# Patient Record
Sex: Female | Born: 1940 | Race: White | Hispanic: No | Marital: Single | Smoking: Never smoker
Health system: Southern US, Community
[De-identification: ages and names within clinical notes are randomized; demographics above are authoritative.]

## PROBLEM LIST (undated history)

## (undated) DIAGNOSIS — J9690 Respiratory failure, unspecified, unspecified whether with hypoxia or hypercapnia: Secondary | ICD-10-CM

## (undated) DIAGNOSIS — I10 Essential (primary) hypertension: Secondary | ICD-10-CM

## (undated) DIAGNOSIS — H409 Unspecified glaucoma: Secondary | ICD-10-CM

## (undated) DIAGNOSIS — F419 Anxiety disorder, unspecified: Secondary | ICD-10-CM

## (undated) DIAGNOSIS — N289 Disorder of kidney and ureter, unspecified: Secondary | ICD-10-CM

## (undated) DIAGNOSIS — K219 Gastro-esophageal reflux disease without esophagitis: Secondary | ICD-10-CM

## (undated) DIAGNOSIS — J45909 Unspecified asthma, uncomplicated: Secondary | ICD-10-CM

## (undated) DIAGNOSIS — I2510 Atherosclerotic heart disease of native coronary artery without angina pectoris: Secondary | ICD-10-CM

## (undated) DIAGNOSIS — J189 Pneumonia, unspecified organism: Secondary | ICD-10-CM

---

## 1998-04-19 ENCOUNTER — Other Ambulatory Visit: Admission: RE | Admit: 1998-04-19 | Discharge: 1998-04-19 | Payer: Self-pay | Admitting: Obstetrics and Gynecology

## 1998-06-13 ENCOUNTER — Inpatient Hospital Stay (HOSPITAL_COMMUNITY): Admission: RE | Admit: 1998-06-13 | Discharge: 1998-06-14 | Payer: Self-pay | Admitting: Obstetrics and Gynecology

## 2000-02-12 ENCOUNTER — Encounter: Admission: RE | Admit: 2000-02-12 | Discharge: 2000-02-12 | Payer: Self-pay | Admitting: Urology

## 2000-02-12 ENCOUNTER — Encounter: Payer: Self-pay | Admitting: Urology

## 2005-12-10 ENCOUNTER — Inpatient Hospital Stay (HOSPITAL_COMMUNITY): Admission: RE | Admit: 2005-12-10 | Discharge: 2005-12-17 | Payer: Self-pay | Admitting: Neurosurgery

## 2005-12-10 ENCOUNTER — Ambulatory Visit: Payer: Self-pay | Admitting: Emergency Medicine

## 2005-12-17 ENCOUNTER — Ambulatory Visit: Payer: Self-pay | Admitting: Physical Medicine & Rehabilitation

## 2005-12-17 ENCOUNTER — Inpatient Hospital Stay (HOSPITAL_COMMUNITY)
Admission: RE | Admit: 2005-12-17 | Discharge: 2005-12-25 | Payer: Self-pay | Admitting: Physical Medicine & Rehabilitation

## 2006-01-08 ENCOUNTER — Encounter: Admission: RE | Admit: 2006-01-08 | Discharge: 2006-01-08 | Payer: Self-pay | Admitting: Neurosurgery

## 2009-05-29 ENCOUNTER — Encounter: Admission: RE | Admit: 2009-05-29 | Discharge: 2009-05-29 | Payer: Self-pay | Admitting: Neurosurgery

## 2009-06-09 ENCOUNTER — Encounter: Admission: RE | Admit: 2009-06-09 | Discharge: 2009-06-09 | Payer: Self-pay | Admitting: Neurosurgery

## 2010-07-19 NOTE — Discharge Summary (Signed)
Judy Mooney, Judy Mooney                ACCOUNT NO.:  [id number]   MEDICAL RECORD NO.:  [id number]          PATIENT TYPE:  INP   LOCATION:  3002                         FACILITY:  [facility]   PHYSICIAN:  Donalee Citrin, M.D.        DATE OF BIRTH:  February 06, 1941   DATE OF ADMISSION:  12/10/2005  DATE OF DISCHARGE:  12/17/2005                                 DISCHARGE SUMMARY   ADMISSION DIAGNOSES:  Severe degenerative scoliosis with spinal stenosis,  neurogenic claudication, and lumbar radiculopathy.   PROCEDURES:  1. Decompressive lumbar laminectomy at L1-2, L2-3, L3-4, and L4-5.  2. Posterior lumbar interbody fusion L1 to L5.   FINAL DIAGNOSES:  Severe degenerative scoliosis with spinal stenosis,  neurogenic claudication, and lumbar radiculopathy.   HOSPITAL COURSE:  The patient was admitted and immediately went to the  operating room with aforementioned procedures performed.  Postoperatively,  the patient did very well, went to the recovery room and then went to the  step-down area.  In step-down, the patient was doing fairly well, was in  postoperative pain; however, this was adequately controlled with pain  medications and a PCA.  The patient progressively mobilized over the couple  days in the hospital with physical and occupational therapy.   The patient initially had some hypovolemic oliguria.  Critical care was  consulted and assisted in managing her hypotension, hypovolemia and  oliguria.  The patient had some elevation in her creatinine that resolved  with fluids over the next couple of days.  In addition, the patient also had  some hyponatremia and anemia.  The patient was transfused for her anemia  with a post transfusion hematocrit of 29.1.  This was stabilized over the  next couple of days.  Throughout the hospitalization, the patient was  progressively mobilized with physical and occupational therapy with which  she did very well.  Subsequently at the time of discharge and transfer  to  rehabilitation, the patient was awake and alert, was tolerating a regular  diet, was ambulating and voiding spontaneously but with assistance, and was  transferred up to the rehab area for continued physical therapy.           ______________________________  Donalee Citrin, M.D.     GC/MEDQ  D:  01/26/2006  T:  01/26/2006  Job:  [id number]

## 2010-07-19 NOTE — H&P (Signed)
NAMEELAYNE, GRUVER                ACCOUNT NO.:  [id number]   MEDICAL RECORD NO.:  [id number]          PATIENT TYPE:  IPS   LOCATION:  4010                         FACILITY:  [facility]   PHYSICIAN:  Ranelle Oyster, M.D.DATE OF BIRTH:  [birth year]   DATE OF ADMISSION:  12/17/2005  DATE OF DISCHARGE:                                HISTORY & PHYSICAL   CHIEF COMPLAINT:  Low back pain and radiculopathy.   HISTORY OF PRESENT ILLNESS:  This is a 70 year old white female with history  of low back pain radiating to the lower extremities.  The patient has  significant scoliosis of L1-L5 with degenerative disk disease.  She  underwent a fusion of L1-L5 on December 10, 2005, by Dr. Wynetta Emery.  The patient  is having issues with postop pain control although this is gradually  improving.  She is having postoperative hemoglobin 6.9 for which she was  transfused 2 units of packed red blood cells on December 10, 2005.  Hemoglobin is increased to 9.0.  The patient has had some loose stool over  the last 24 hours which is felt to be secondary to her stool softeners.  Colace was stopped yesterday.  However, the patient has persisted with  frequent loose BMs.  The patient continues needing significant assistance  with self-care and ADLs, as well as mobility.   REVIEW OF SYSTEMS:  Positive for insomnia, anxiety, low back pain, and  diarrhea.  Other pertinent positives as listed above.   PAST MEDICAL HISTORY:  1. Positive for hypertension.  2. Anxiety.  3. Reflux disease.  4. Melanoma on the right leg, status post surgical incision.  5. Bladder tack.  6. Hysterectomy.  7. Appendectomy.  8. The patient denies alcohol or tobacco history.   FAMILY HISTORY:  Positive for coronary artery disease.   SOCIAL HISTORY:  The patient lives with her husband in [place].  Husband  and local family can assist her as needed.  The patient has a one-level  house with a ramp.  She was independent prior to arrival.   ALLERGIES:  1. SEPTRA.  2. TETRACYCLINE.  3. PENICILLIN.   MEDICATIONS AT HOME:  1. Hyoscyamine 0.125 mg daily p.r.n.  2. Darvocet p.r.n.  3. Doxazosin 4 mg nightly.  4. Enalapril 20 mg b.i.d.  5. Prevacid 30 mg daily.  6. Alprazolam 0.5 mg b.i.d. and nightly.  7. TUMS p.r.n.  8. Benadryl 25 mg nightly p.r.n.   LABORATORY DATA:  Hemoglobin 9.0, white count 11.0, platelets 109000.  Sodium 135.  Potassium 3.9.  BUN and creatinine 12 and 1.2.   PHYSICAL EXAMINATION:  Blood pressure is 130/80.  Pulse is 80.  Respiratory  rate 18.  Temperature 98.4.  The patient is pleasant.  No acute distress.  She is alert and oriented x3.  She has multiple ecchymosis on the forearms  and upper arm.  She has abrasions over the lip and nose.  Ear, nose and  throat exam was unremarkable otherwise except for dry oral mucosa.  There  was some tenting noted in the skin as well.  Neck was supple.  Non JVD,  lymphadenopathy.  Chest was clear to auscultation bilateral without wheezes,  rales or rhonchi.  Heart is regular rate and rhythm without murmur, rubs or  gallops.  Abdomen soft, nontender.  Bowel sounds are positive.  No  distention was noted.  Pulse are 2+ and no clubbing, cyanosis or edema was  appreciated in the extremities.  Neurologically,  cranial nerves II through  XII are intact.  Reflexes are 1+ in lower extremities.  Sensation appears to  be grossly intact in all 4 extremities.  Judgment, orientation, memory and  move are all appropriate today.  Motor function was 5/5 in the upper  extremities.  Lower extremity strength was 2/5 approximately to 4/5  distally.  She was a bit stronger on the right than on the left.   ASSESSMENT AND PLAN:  1. Functional deficits secondary to lumbar scoliosis degenerative disk      disease status post fusion surgery at the L1-L5.  Begin comprehension      patient rehab with PT to assess and treat for mobility and lower      extremity use.  OT will assess for  upper extremity use and ADLs.  24-      hour rehab nursing will assist in bowel, bladder, pain and skin      management.  The rehab case manager/social worker will assess for psych      and social needs.  Estimated length of stay is 10 days.  Goals:      Supervision.  Prognosis:  Good.  2. Pain management:  Darvocet p.r.n. and Flexeril p.r.n.  3. DVT prophylaxis:  Subcutaneous Lovenox.  4. Postoperative anemia:  Check a.m. CBC.  Continue iron supplement.  5. Hypertension:  Monitor current numbers.  6. Anxiety:  Xanax t.i.d.  7. Loose stool.  Stop Colace.  Begin p.r.n. Imodium.  Check next loose      stool for C. diff.      Ranelle Oyster, M.D.  Electronically Signed     ZTS/MEDQ  D:  12/17/2005  T:  12/18/2005  Job:  [id number]

## 2010-07-19 NOTE — Op Note (Signed)
Judy Mooney, Judy Mooney                ACCOUNT NO.:  [id number]   MEDICAL RECORD NO.:  [id number]          PATIENT TYPE:  INP   LOCATION:  3113                         FACILITY:  [facility]   PHYSICIAN:  Donalee Citrin, M.D.        DATE OF BIRTH:  [birth year]   DATE OF PROCEDURE:  12/10/2005  DATE OF DISCHARGE:                                 OPERATIVE REPORT   PREOPERATIVE DIAGNOSES:  1. Degenerative scoliosis L1 to L5.  2. Lumbar spinal stenosis L2-L3, L3-L4 and L4-L5.  3. Lumbar degenerative disk disease L2-L3, L3-L4 and L4-L5 and L3, L4 and      L5 radiculopathy bilaterally.   PROCEDURE:  1. Decompressive lumbar laminectomies L2-L3, L3-L4, and L4-L5 in excess of      what we needed for a standard interbody fusion.  2. Posterior lumbar interbody fusion L2-L3, L3-L4 and L4-L5 using hybrid      Telamon peak cages and Tangent allograft wedges, 1 cage and 1 wedge at      each level.  3. Pedicle screw fixation L1 to L5 using the 6.35 Legacy pedicle screw      system.  4. Posterolateral listhesis L1 to L5 using locally harvested allograft      mixed with VTOS bone substitute.  5. Reduction of spinal deformity L2 to L5.  6. Placement of medium Hemovac drain.   SURGEON:  Donalee Citrin, M.D.   Threasa HeadsYetta Barre.   ANESTHESIA:  General endotracheal.   HISTORY OF PRESENT ILLNESS:  The patient is a very pleasant 70 year old  female who has had longstanding back and progressive back and leg weakness  and pain with neurogenic claudication.  Preoperative imaging showed severe  lumbar spinal stenosis predominantly at L2-L3, L3-L4 and L4-L5 and  degenerative scoliosis at L2-L3, L3-L4 and L4-L5 as well as partially at L1.  The patient failed all forms of conservative treatment with physical  therapy, steroid injections, time, anti-inflammatories, and got  progressively worsening weakness in her legs and decreased quality of life  and difficulty ambulating.  Due to the failure of conservative treatment,  her preoperative imaging and her physical exam, the patient was recommended  decompressive laminectomies at L2-L3, L3-L4, L4-L5 and because of the degree  of degenerative scoliosis and degenerative disk disease, it was felt the  only way to reduce the scoliotic deformity and obtain adequate decompression  was to augment this with an interbody fusion.  Due to scoliosis up to L1,  the fusion was carried up to L1 without any interbody work.  The disk space  was reasonably preserved without scoliotic collapse at L1-L2, so it was  elected not to go into the disk space at L1-L2.   FINDINGS:  Marked facet arthropathy, degenerative joint disease and  degenerative disk disease at L2-L3, L3-L4, and L4-L5.  The L4-L5 facet on  the right was noted to be markedly diastased, almost 1 cm in width.  The  other joints at L2-L3, L3-L4 and L4-L5 were causing marked hourglass  compression on the thecal sac bilaterally with severe collapse of the L3, L4  and L5  nerve roots.   DESCRIPTION OF PROCEDURE:  The patient was brought to the OR and was induced  under general anesthesia.  Was positioned prone on a Wilson frame and draped  in a sterile fashion.  After infiltration of 10 mL of lidocaine with  epinephrine, a midline incision was made and Bovie electrocautery was used  to taken down subq.  Subperiosteal dissection was carried out to the lamina  of L1, L2, L3, L4 and L5 bilaterally.  Self-retaining retractor was placed  at L1 through L5 pedicles and TPs were identified.  __________  confirmed  localization of the appropriate level.  Then after identification of the  appropriate levels,  the spinous processes at L2, L3 and L4 were removed to  decompress the L2-L3, L3-L4, and L4-L5 disk spaces.  The joint spaces of  facet joints revealed markedly degenerating collapse and severely  hypertrophic.  These were all drilled down with a high-speed drill and a  Leksell rongeur.  Complete central decompression of  laminectomy was  performed and working around the marked hourglass compression, immediately  visualized predominantly at L3-L4 and L4-L5.  After the standard  decompression, complete medial facetectomies were performed working above  the ligament.  Then after this was achieved, the ligament was teased away  with a 4 Penfield, removed in piecemeal fashion decompressing the L2, L3, L4  and L5 nerve roots bilaterally.  After complete medial facetectomies were  performed, all 8 of those nerve roots were radically decompressed out their  foramen and skeletonized and significantly unroofed.  The disk spaces were  inspected.  The epidural veins were coagulated, disks were noted to be  markedly collapsed and degenerated.  First, attention was taken to pedicle  screw placement.  Using a high-speed drill, a pilot hole was drilled at L5  on the patient's right side, cannulated with the awl, probed from within the  pedicle as well as within the canal, tapped with a 5.5 tap, probed again and  a 6.5 x 45 screw was inserted L5 on the right.  Fluoroscopy confirmed  trajectory at each step along the way as well as probing from within the  pedicle and within the canal and confirmed no medial or lateral breach.  This procedure was repeated at L4, L3, L2 and L1 on the patient's right side  as well as from L1 to L5 on the patient's left side.  All 10 screws had  excellent purchase.  All were confirmed with fluoroscopy and no medial or  lateral breach was confirmed from probing within the pedicle and within the  canal and bony landmarks.  After all 10 screws had been placed, __________  interbody work using a __________  reflecting the left L5 nerve root  medially, annulotomy was made at the L4-L5 disk space.  This disk space was  inspected and noted to be markedly degenerated.  A size 10 distractor was  inserted.  This has good apposition of the endplates and felt to be appropriate size for the grafts, so 10 x  22 Telamon and 10 x 26 mm Tangent  grafts were opened up for this.  The interspace was prepared with size 10  cutter and chisel after large central disks were removed from the central  compartment decompressing the central canal and a Telamon cage was inserted  on the patient's right side.  The allograft and bone graft wedges were  inserted centrally against the Telamon after preparation  being placed on  both sides and  a Tangent was inserted on the patient's left side.  This  procedure was repeated at each interspace at L3-L4 next and L2-L3 following  with Telamon on the patient's right side at L3-L4, Tangent on the left at L3-  L4, Telamon on the left at L2-L3 and Tangent on the right at L2-L3. All  disks, especially L2-L3 and L3-L4, were markedly collapsed and degenerated.  They were opened up with a size 8 distractor at L3-L4.  This significantly  reduced the scoliotic deformity at L3-L4 as well as the 10 distractor had  done that at L4-L5.  At L2-L3 in addition, initially a 7 distractor was  inserted due to the marked collapse of the disk space and an 8 distractor  was sequentially sized up and using the distractors and placement of the  wedges, reduced her scoliosis at both of these levels.   After all of the above work had been done and the bone grafts appeared to be  in good position, the wound was copiously irrigated and meticulous  hemostasis was maintained.  The neural foramina were explored with a hockey  stick and coronary dilator and confirmed to be widely patent.  Gelfoam was  laid on top of the dura.  The wound was copiously irrigated.  Aggressive  decortication was carried out on the TPs bilaterally.  The remainder of the  locally harvested allograft mixed with VTOS bone substitute was packed along  lateral gutters and TPs and lateral facet complexes as it had already been  packed centrally between the interbody spaces.  Then after all of the grafts  had been inserted, 2  rods were sized, cut, lordosed.  The nuts were  tightened down at L5.  L4 was compressed against L5, L3 compressed against  L4, L2 compressed against L3 and L1 was tightened in situ.  Then a 4 x 22  CrossLink was inserted.  Postop fluoroscopy confirmed good position of the  screws, rods, and bone grafts.  Gelfoam was again reinspected.  Neural  foramina were reinspected.  The medium Hemovac drain was placed underneath  the crosslink.  The muscles were approximated with just some hemostatic  suture.  The fascia and subcutaneous tissue were closed  with interrupted Vicryl and the skin was closed with running 4-0  subcuticular.  Benzoin and Steri-Strips were applied.  The patient went to  the recovery room in stable condition.  At the end of the case the sponge  and instrument counts were correct.           ______________________________  Donalee Citrin, M.D.     GC/MEDQ  D:  12/10/2005  T:  12/11/2005  Job:  [id number]

## 2010-07-19 NOTE — Discharge Summary (Signed)
Judy Mooney, Judy Mooney                ACCOUNT NO.:  [id number]   MEDICAL RECORD NO.:  [id number]          PATIENT TYPE:  IPS   LOCATION:  4010                         FACILITY:  [facility]   PHYSICIAN:  Mariam Dollar, P.A.  DATE OF BIRTH:  26-Nov-1940   DATE OF ADMISSION:  12/17/2005  DATE OF DISCHARGE:  12/25/2005                                 DISCHARGE SUMMARY   DISCHARGE DIAGNOSES:  1. Status post posterior spinal fusion lumbar L1-L5, secondary scoliosis,      degenerative disc disease, December 10, 2005.  2. Pain management.  3. Subcutaneous Lovenox for deep vein thrombosis prophylaxis.  4. Postoperative anemia.  5. Hypertension.  6. Anxiety.  7. Gastroesophageal reflux disease.   A 70 year old female admitted December 10, 2005 with low back pain radiating  to the lower extremities.  X-rays and imaging with scoliosis lumbar L1-L5.  Underwent posterior spinal fusion lumbar L1-L5 December 10, 2005 per Dr.  Wynetta Emery.  Postoperative pain control. PCA discontinued December 13, 2005.  Postoperative anemia 6.9, transfused 2 units of packed red blood cells  December 10, 2005, with hemoglobin improved to 9.  No chest pain, no nausea  or vomiting.  The patient was admitted for comprehensive rehab program.   PAST MEDICAL HISTORY:  See discharge diagnoses.   No alcohol or tobacco.   ALLERGIES:  1. SEPTRA.  2. TETRACYCLINE.  3. PENICILLIN.   SOCIAL HISTORY:  Lives with husband in [place].  Husband and local  family can assist. One level home with a ramp.   MEDICATION PRIOR TO ADMISSION:  1. Darvocet as needed.  2. Doxazosin  4 mg at bedtime  3. Enalapril 20 mg twice daily.  4. Prevacid 30 mg daily.  5. Alprazolam 3 times daily.  6. Benadryl at bedtime.  7. Hyoscyamine 0.125 mg as needed.   REHABILITATION HOSPITAL COURSE:  The patient was admitted to inpatient rehab  services, with therapies initiated on a 3-hour daily basis consisting of  physical therapy, occupational therapy and  rehabilitation nursing. The  following issues were followed during the patient's rehabilitation stay.  Pertaining to Ms. Lanahan's recent posterior spinal fusion lumbar L1-L5,  December 10, 2005, surgical site healing nicely.  She was wearing a back  corset when out of bed. Neurovascular sensation intact.  She was minimal  assist for transfers, close supervision for ambulation.  She was using  Vicodin on a limited basis for pain as well as Flexeril for muscle spasms.  Subcutaneous Lovenox was initiated for deep vein thrombosis prophylaxis.  Postoperative anemia stable, with latest hemoglobin of 10.2, hematocrit  30.2.  She was using Xanax on a limited basis for anxiety.  She did have a  documented history of hypertension. with diastolic pressures 81-93.  Her  enalapril would be resumed as prior to hospital admission.  She was treated  for a Escherichia coli urinary tract infection with Cipro during her  rehabilitation stay.  She denied any dysuria or hematuria.  She was  discharged to home in stable condition.   Latest labs showed BUN 19, creatinine 1.3, sodium 135, potassium 4.7. She  was treated for a hypokalemia at 2.6, with supplement added, improved to  4.7. Hemoglobin 10.2, hematocrit 30.2.   DISCHARGE MEDICATIONS:  1. Xanax 0.5 mg 3 times daily.  2. Protonix 40 mg daily.  3. Multivitamin daily.  4. Enalapril 20 mg twice daily.  5. Cipro 250 mg twice daily x5 days.  6. Vicodin 5/500, 1 or 2 tablets every 6 hours as needed for pain,      dispense of 180 pills.  7. Flexeril 10 mg 3 times daily as needed.   ACTIVITY:  As tolerated.   DIET:  Regular.   WOUND CARE:  Cleanse incision daily with warm soap and water. Back corset  when out of bed.   The patient was advised to follow up with Dr. Wynetta Emery.      Mariam Dollar, P.A.     DA/MEDQ  D:  12/24/2005  T:  12/25/2005  Job:  [id number]   cc:   Donalee Citrin, M.D.  Dr. Tomasa Blase

## 2013-05-04 ENCOUNTER — Encounter (INDEPENDENT_AMBULATORY_CARE_PROVIDER_SITE_OTHER): Payer: Self-pay

## 2015-05-11 DIAGNOSIS — H40013 Open angle with borderline findings, low risk, bilateral: Secondary | ICD-10-CM | POA: Diagnosis not present

## 2015-05-11 DIAGNOSIS — H5203 Hypermetropia, bilateral: Secondary | ICD-10-CM | POA: Diagnosis not present

## 2015-08-03 DIAGNOSIS — E785 Hyperlipidemia, unspecified: Secondary | ICD-10-CM | POA: Diagnosis not present

## 2015-08-03 DIAGNOSIS — I10 Essential (primary) hypertension: Secondary | ICD-10-CM | POA: Diagnosis not present

## 2015-08-03 DIAGNOSIS — M5137 Other intervertebral disc degeneration, lumbosacral region: Secondary | ICD-10-CM | POA: Diagnosis not present

## 2015-08-03 DIAGNOSIS — R7301 Impaired fasting glucose: Secondary | ICD-10-CM | POA: Diagnosis not present

## 2015-08-03 DIAGNOSIS — Z6831 Body mass index (BMI) 31.0-31.9, adult: Secondary | ICD-10-CM | POA: Diagnosis not present

## 2015-08-03 DIAGNOSIS — D509 Iron deficiency anemia, unspecified: Secondary | ICD-10-CM | POA: Diagnosis not present

## 2015-08-03 DIAGNOSIS — N3010 Interstitial cystitis (chronic) without hematuria: Secondary | ICD-10-CM | POA: Diagnosis not present

## 2016-02-08 DIAGNOSIS — N3010 Interstitial cystitis (chronic) without hematuria: Secondary | ICD-10-CM | POA: Diagnosis not present

## 2016-02-08 DIAGNOSIS — D509 Iron deficiency anemia, unspecified: Secondary | ICD-10-CM | POA: Diagnosis not present

## 2016-02-08 DIAGNOSIS — E785 Hyperlipidemia, unspecified: Secondary | ICD-10-CM | POA: Diagnosis not present

## 2016-02-08 DIAGNOSIS — Z23 Encounter for immunization: Secondary | ICD-10-CM | POA: Diagnosis not present

## 2016-02-08 DIAGNOSIS — E559 Vitamin D deficiency, unspecified: Secondary | ICD-10-CM | POA: Diagnosis not present

## 2016-02-08 DIAGNOSIS — I129 Hypertensive chronic kidney disease with stage 1 through stage 4 chronic kidney disease, or unspecified chronic kidney disease: Secondary | ICD-10-CM | POA: Diagnosis not present

## 2016-02-08 DIAGNOSIS — M5137 Other intervertebral disc degeneration, lumbosacral region: Secondary | ICD-10-CM | POA: Diagnosis not present

## 2016-02-08 DIAGNOSIS — Z6832 Body mass index (BMI) 32.0-32.9, adult: Secondary | ICD-10-CM | POA: Diagnosis not present

## 2016-02-08 DIAGNOSIS — L989 Disorder of the skin and subcutaneous tissue, unspecified: Secondary | ICD-10-CM | POA: Diagnosis not present

## 2016-02-08 DIAGNOSIS — Z1389 Encounter for screening for other disorder: Secondary | ICD-10-CM | POA: Diagnosis not present

## 2016-02-08 DIAGNOSIS — R7301 Impaired fasting glucose: Secondary | ICD-10-CM | POA: Diagnosis not present

## 2016-02-08 DIAGNOSIS — Z9181 History of falling: Secondary | ICD-10-CM | POA: Diagnosis not present

## 2016-03-25 DIAGNOSIS — N184 Chronic kidney disease, stage 4 (severe): Secondary | ICD-10-CM | POA: Diagnosis not present

## 2016-03-25 DIAGNOSIS — K219 Gastro-esophageal reflux disease without esophagitis: Secondary | ICD-10-CM | POA: Diagnosis not present

## 2016-03-25 DIAGNOSIS — Z6832 Body mass index (BMI) 32.0-32.9, adult: Secondary | ICD-10-CM | POA: Diagnosis not present

## 2016-03-25 DIAGNOSIS — I129 Hypertensive chronic kidney disease with stage 1 through stage 4 chronic kidney disease, or unspecified chronic kidney disease: Secondary | ICD-10-CM | POA: Diagnosis not present

## 2016-03-25 DIAGNOSIS — Z139 Encounter for screening, unspecified: Secondary | ICD-10-CM | POA: Diagnosis not present

## 2016-08-15 DIAGNOSIS — N3010 Interstitial cystitis (chronic) without hematuria: Secondary | ICD-10-CM | POA: Diagnosis not present

## 2016-08-15 DIAGNOSIS — M5137 Other intervertebral disc degeneration, lumbosacral region: Secondary | ICD-10-CM | POA: Diagnosis not present

## 2016-08-15 DIAGNOSIS — N183 Chronic kidney disease, stage 3 (moderate): Secondary | ICD-10-CM | POA: Diagnosis not present

## 2016-08-15 DIAGNOSIS — E785 Hyperlipidemia, unspecified: Secondary | ICD-10-CM | POA: Diagnosis not present

## 2016-08-15 DIAGNOSIS — R7301 Impaired fasting glucose: Secondary | ICD-10-CM | POA: Diagnosis not present

## 2016-08-15 DIAGNOSIS — Z6829 Body mass index (BMI) 29.0-29.9, adult: Secondary | ICD-10-CM | POA: Diagnosis not present

## 2016-08-15 DIAGNOSIS — I129 Hypertensive chronic kidney disease with stage 1 through stage 4 chronic kidney disease, or unspecified chronic kidney disease: Secondary | ICD-10-CM | POA: Diagnosis not present

## 2016-08-15 DIAGNOSIS — E559 Vitamin D deficiency, unspecified: Secondary | ICD-10-CM | POA: Diagnosis not present

## 2016-08-15 DIAGNOSIS — D509 Iron deficiency anemia, unspecified: Secondary | ICD-10-CM | POA: Diagnosis not present

## 2016-10-17 DIAGNOSIS — H35363 Drusen (degenerative) of macula, bilateral: Secondary | ICD-10-CM | POA: Diagnosis not present

## 2016-10-17 DIAGNOSIS — H5203 Hypermetropia, bilateral: Secondary | ICD-10-CM | POA: Diagnosis not present

## 2016-11-28 DIAGNOSIS — K529 Noninfective gastroenteritis and colitis, unspecified: Secondary | ICD-10-CM | POA: Diagnosis not present

## 2016-11-28 DIAGNOSIS — Z6833 Body mass index (BMI) 33.0-33.9, adult: Secondary | ICD-10-CM | POA: Diagnosis not present

## 2016-12-01 DIAGNOSIS — K529 Noninfective gastroenteritis and colitis, unspecified: Secondary | ICD-10-CM | POA: Diagnosis not present

## 2017-02-20 DIAGNOSIS — Z9181 History of falling: Secondary | ICD-10-CM | POA: Diagnosis not present

## 2017-02-20 DIAGNOSIS — N183 Chronic kidney disease, stage 3 (moderate): Secondary | ICD-10-CM | POA: Diagnosis not present

## 2017-02-20 DIAGNOSIS — Z139 Encounter for screening, unspecified: Secondary | ICD-10-CM | POA: Diagnosis not present

## 2017-02-20 DIAGNOSIS — N3010 Interstitial cystitis (chronic) without hematuria: Secondary | ICD-10-CM | POA: Diagnosis not present

## 2017-02-20 DIAGNOSIS — I129 Hypertensive chronic kidney disease with stage 1 through stage 4 chronic kidney disease, or unspecified chronic kidney disease: Secondary | ICD-10-CM | POA: Diagnosis not present

## 2017-02-20 DIAGNOSIS — D509 Iron deficiency anemia, unspecified: Secondary | ICD-10-CM | POA: Diagnosis not present

## 2017-02-20 DIAGNOSIS — Z6833 Body mass index (BMI) 33.0-33.9, adult: Secondary | ICD-10-CM | POA: Diagnosis not present

## 2017-02-20 DIAGNOSIS — Z23 Encounter for immunization: Secondary | ICD-10-CM | POA: Diagnosis not present

## 2017-02-20 DIAGNOSIS — M5137 Other intervertebral disc degeneration, lumbosacral region: Secondary | ICD-10-CM | POA: Diagnosis not present

## 2017-02-20 DIAGNOSIS — Z1331 Encounter for screening for depression: Secondary | ICD-10-CM | POA: Diagnosis not present

## 2017-02-20 DIAGNOSIS — E559 Vitamin D deficiency, unspecified: Secondary | ICD-10-CM | POA: Diagnosis not present

## 2017-02-20 DIAGNOSIS — R7301 Impaired fasting glucose: Secondary | ICD-10-CM | POA: Diagnosis not present

## 2017-02-20 DIAGNOSIS — E785 Hyperlipidemia, unspecified: Secondary | ICD-10-CM | POA: Diagnosis not present

## 2017-05-26 DIAGNOSIS — N390 Urinary tract infection, site not specified: Secondary | ICD-10-CM | POA: Diagnosis not present

## 2017-05-26 DIAGNOSIS — N3020 Other chronic cystitis without hematuria: Secondary | ICD-10-CM | POA: Diagnosis not present

## 2017-07-03 DIAGNOSIS — R600 Localized edema: Secondary | ICD-10-CM | POA: Diagnosis not present

## 2017-08-21 DIAGNOSIS — M5137 Other intervertebral disc degeneration, lumbosacral region: Secondary | ICD-10-CM | POA: Diagnosis not present

## 2017-08-21 DIAGNOSIS — D509 Iron deficiency anemia, unspecified: Secondary | ICD-10-CM | POA: Diagnosis not present

## 2017-08-21 DIAGNOSIS — E785 Hyperlipidemia, unspecified: Secondary | ICD-10-CM | POA: Diagnosis not present

## 2017-08-21 DIAGNOSIS — N3020 Other chronic cystitis without hematuria: Secondary | ICD-10-CM | POA: Diagnosis not present

## 2017-08-21 DIAGNOSIS — R7301 Impaired fasting glucose: Secondary | ICD-10-CM | POA: Diagnosis not present

## 2017-08-21 DIAGNOSIS — N183 Chronic kidney disease, stage 3 (moderate): Secondary | ICD-10-CM | POA: Diagnosis not present

## 2017-08-21 DIAGNOSIS — E559 Vitamin D deficiency, unspecified: Secondary | ICD-10-CM | POA: Diagnosis not present

## 2017-08-21 DIAGNOSIS — I129 Hypertensive chronic kidney disease with stage 1 through stage 4 chronic kidney disease, or unspecified chronic kidney disease: Secondary | ICD-10-CM | POA: Diagnosis not present

## 2017-08-21 DIAGNOSIS — Z1339 Encounter for screening examination for other mental health and behavioral disorders: Secondary | ICD-10-CM | POA: Diagnosis not present

## 2017-08-21 DIAGNOSIS — Z6832 Body mass index (BMI) 32.0-32.9, adult: Secondary | ICD-10-CM | POA: Diagnosis not present

## 2017-10-06 DIAGNOSIS — S42032A Displaced fracture of lateral end of left clavicle, initial encounter for closed fracture: Secondary | ICD-10-CM | POA: Diagnosis not present

## 2017-10-06 DIAGNOSIS — S42002A Fracture of unspecified part of left clavicle, initial encounter for closed fracture: Secondary | ICD-10-CM | POA: Diagnosis not present

## 2017-10-06 DIAGNOSIS — S42022A Displaced fracture of shaft of left clavicle, initial encounter for closed fracture: Secondary | ICD-10-CM | POA: Diagnosis not present

## 2017-11-13 DIAGNOSIS — J9801 Acute bronchospasm: Secondary | ICD-10-CM | POA: Diagnosis not present

## 2017-11-13 DIAGNOSIS — Z6832 Body mass index (BMI) 32.0-32.9, adult: Secondary | ICD-10-CM | POA: Diagnosis not present

## 2018-02-26 DIAGNOSIS — Z23 Encounter for immunization: Secondary | ICD-10-CM | POA: Diagnosis not present

## 2018-02-26 DIAGNOSIS — N183 Chronic kidney disease, stage 3 (moderate): Secondary | ICD-10-CM | POA: Diagnosis not present

## 2018-02-26 DIAGNOSIS — N3010 Interstitial cystitis (chronic) without hematuria: Secondary | ICD-10-CM | POA: Diagnosis not present

## 2018-02-26 DIAGNOSIS — Z9181 History of falling: Secondary | ICD-10-CM | POA: Diagnosis not present

## 2018-02-26 DIAGNOSIS — E559 Vitamin D deficiency, unspecified: Secondary | ICD-10-CM | POA: Diagnosis not present

## 2018-02-26 DIAGNOSIS — D509 Iron deficiency anemia, unspecified: Secondary | ICD-10-CM | POA: Diagnosis not present

## 2018-02-26 DIAGNOSIS — M5137 Other intervertebral disc degeneration, lumbosacral region: Secondary | ICD-10-CM | POA: Diagnosis not present

## 2018-02-26 DIAGNOSIS — I129 Hypertensive chronic kidney disease with stage 1 through stage 4 chronic kidney disease, or unspecified chronic kidney disease: Secondary | ICD-10-CM | POA: Diagnosis not present

## 2018-02-26 DIAGNOSIS — Z1331 Encounter for screening for depression: Secondary | ICD-10-CM | POA: Diagnosis not present

## 2018-02-26 DIAGNOSIS — Z139 Encounter for screening, unspecified: Secondary | ICD-10-CM | POA: Diagnosis not present

## 2018-02-26 DIAGNOSIS — R7301 Impaired fasting glucose: Secondary | ICD-10-CM | POA: Diagnosis not present

## 2018-02-26 DIAGNOSIS — E785 Hyperlipidemia, unspecified: Secondary | ICD-10-CM | POA: Diagnosis not present

## 2018-03-04 DIAGNOSIS — R6889 Other general symptoms and signs: Secondary | ICD-10-CM | POA: Diagnosis not present

## 2018-03-04 DIAGNOSIS — R3915 Urgency of urination: Secondary | ICD-10-CM | POA: Diagnosis not present

## 2018-03-05 DIAGNOSIS — N390 Urinary tract infection, site not specified: Secondary | ICD-10-CM | POA: Diagnosis not present

## 2018-04-06 DIAGNOSIS — N390 Urinary tract infection, site not specified: Secondary | ICD-10-CM | POA: Diagnosis not present

## 2018-04-19 DIAGNOSIS — T148XXA Other injury of unspecified body region, initial encounter: Secondary | ICD-10-CM | POA: Diagnosis not present

## 2018-04-19 DIAGNOSIS — N3010 Interstitial cystitis (chronic) without hematuria: Secondary | ICD-10-CM | POA: Diagnosis not present

## 2018-04-19 DIAGNOSIS — Z23 Encounter for immunization: Secondary | ICD-10-CM | POA: Diagnosis not present

## 2018-05-07 DIAGNOSIS — I8310 Varicose veins of unspecified lower extremity with inflammation: Secondary | ICD-10-CM | POA: Diagnosis not present

## 2018-05-07 DIAGNOSIS — R600 Localized edema: Secondary | ICD-10-CM | POA: Diagnosis not present

## 2018-05-10 DIAGNOSIS — L97911 Non-pressure chronic ulcer of unspecified part of right lower leg limited to breakdown of skin: Secondary | ICD-10-CM | POA: Diagnosis not present

## 2018-05-10 DIAGNOSIS — I8310 Varicose veins of unspecified lower extremity with inflammation: Secondary | ICD-10-CM | POA: Diagnosis not present

## 2018-05-14 DIAGNOSIS — H353132 Nonexudative age-related macular degeneration, bilateral, intermediate dry stage: Secondary | ICD-10-CM | POA: Diagnosis not present

## 2018-05-14 DIAGNOSIS — H40013 Open angle with borderline findings, low risk, bilateral: Secondary | ICD-10-CM | POA: Diagnosis not present

## 2018-05-17 DIAGNOSIS — L97911 Non-pressure chronic ulcer of unspecified part of right lower leg limited to breakdown of skin: Secondary | ICD-10-CM | POA: Diagnosis not present

## 2018-06-21 DIAGNOSIS — L97911 Non-pressure chronic ulcer of unspecified part of right lower leg limited to breakdown of skin: Secondary | ICD-10-CM | POA: Diagnosis not present

## 2018-06-21 DIAGNOSIS — R600 Localized edema: Secondary | ICD-10-CM | POA: Diagnosis not present

## 2018-06-21 DIAGNOSIS — I8310 Varicose veins of unspecified lower extremity with inflammation: Secondary | ICD-10-CM | POA: Diagnosis not present

## 2018-07-19 DIAGNOSIS — S81001A Unspecified open wound, right knee, initial encounter: Secondary | ICD-10-CM | POA: Diagnosis not present

## 2018-09-10 DIAGNOSIS — R399 Unspecified symptoms and signs involving the genitourinary system: Secondary | ICD-10-CM | POA: Diagnosis not present

## 2018-09-10 DIAGNOSIS — N183 Chronic kidney disease, stage 3 (moderate): Secondary | ICD-10-CM | POA: Diagnosis not present

## 2018-09-10 DIAGNOSIS — I129 Hypertensive chronic kidney disease with stage 1 through stage 4 chronic kidney disease, or unspecified chronic kidney disease: Secondary | ICD-10-CM | POA: Diagnosis not present

## 2018-09-10 DIAGNOSIS — D509 Iron deficiency anemia, unspecified: Secondary | ICD-10-CM | POA: Diagnosis not present

## 2018-09-10 DIAGNOSIS — E559 Vitamin D deficiency, unspecified: Secondary | ICD-10-CM | POA: Diagnosis not present

## 2018-09-10 DIAGNOSIS — R7301 Impaired fasting glucose: Secondary | ICD-10-CM | POA: Diagnosis not present

## 2018-09-10 DIAGNOSIS — N3010 Interstitial cystitis (chronic) without hematuria: Secondary | ICD-10-CM | POA: Diagnosis not present

## 2018-09-10 DIAGNOSIS — F419 Anxiety disorder, unspecified: Secondary | ICD-10-CM | POA: Diagnosis not present

## 2018-09-10 DIAGNOSIS — M5137 Other intervertebral disc degeneration, lumbosacral region: Secondary | ICD-10-CM | POA: Diagnosis not present

## 2018-09-10 DIAGNOSIS — E785 Hyperlipidemia, unspecified: Secondary | ICD-10-CM | POA: Diagnosis not present

## 2018-09-21 DIAGNOSIS — Z9181 History of falling: Secondary | ICD-10-CM | POA: Diagnosis not present

## 2018-09-21 DIAGNOSIS — Z0000 Encounter for general adult medical examination without abnormal findings: Secondary | ICD-10-CM | POA: Diagnosis not present

## 2018-09-21 DIAGNOSIS — E785 Hyperlipidemia, unspecified: Secondary | ICD-10-CM | POA: Diagnosis not present

## 2018-09-21 DIAGNOSIS — Z6831 Body mass index (BMI) 31.0-31.9, adult: Secondary | ICD-10-CM | POA: Diagnosis not present

## 2018-09-21 DIAGNOSIS — N959 Unspecified menopausal and perimenopausal disorder: Secondary | ICD-10-CM | POA: Diagnosis not present

## 2018-09-21 DIAGNOSIS — Z1231 Encounter for screening mammogram for malignant neoplasm of breast: Secondary | ICD-10-CM | POA: Diagnosis not present

## 2018-09-21 DIAGNOSIS — Z1331 Encounter for screening for depression: Secondary | ICD-10-CM | POA: Diagnosis not present

## 2018-10-15 DIAGNOSIS — H47213 Primary optic atrophy, bilateral: Secondary | ICD-10-CM | POA: Diagnosis not present

## 2018-12-03 DIAGNOSIS — H47293 Other optic atrophy, bilateral: Secondary | ICD-10-CM | POA: Diagnosis not present

## 2018-12-03 DIAGNOSIS — H35363 Drusen (degenerative) of macula, bilateral: Secondary | ICD-10-CM | POA: Diagnosis not present

## 2018-12-03 DIAGNOSIS — H40013 Open angle with borderline findings, low risk, bilateral: Secondary | ICD-10-CM | POA: Diagnosis not present

## 2018-12-03 DIAGNOSIS — H353131 Nonexudative age-related macular degeneration, bilateral, early dry stage: Secondary | ICD-10-CM | POA: Diagnosis not present

## 2019-01-21 DIAGNOSIS — J208 Acute bronchitis due to other specified organisms: Secondary | ICD-10-CM | POA: Diagnosis not present

## 2019-01-21 DIAGNOSIS — B9689 Other specified bacterial agents as the cause of diseases classified elsewhere: Secondary | ICD-10-CM | POA: Diagnosis not present

## 2019-02-23 DIAGNOSIS — J069 Acute upper respiratory infection, unspecified: Secondary | ICD-10-CM | POA: Diagnosis not present

## 2019-03-08 DIAGNOSIS — J208 Acute bronchitis due to other specified organisms: Secondary | ICD-10-CM | POA: Diagnosis not present

## 2019-03-08 DIAGNOSIS — K645 Perianal venous thrombosis: Secondary | ICD-10-CM | POA: Diagnosis not present

## 2019-03-08 DIAGNOSIS — B9689 Other specified bacterial agents as the cause of diseases classified elsewhere: Secondary | ICD-10-CM | POA: Diagnosis not present

## 2019-03-22 DIAGNOSIS — R197 Diarrhea, unspecified: Secondary | ICD-10-CM | POA: Diagnosis not present

## 2019-03-22 DIAGNOSIS — E860 Dehydration: Secondary | ICD-10-CM | POA: Diagnosis not present

## 2019-03-22 DIAGNOSIS — A0472 Enterocolitis due to Clostridium difficile, not specified as recurrent: Secondary | ICD-10-CM | POA: Diagnosis not present

## 2019-03-23 DIAGNOSIS — A0472 Enterocolitis due to Clostridium difficile, not specified as recurrent: Secondary | ICD-10-CM | POA: Diagnosis not present

## 2019-03-23 DIAGNOSIS — R197 Diarrhea, unspecified: Secondary | ICD-10-CM | POA: Diagnosis not present

## 2019-06-10 DIAGNOSIS — E785 Hyperlipidemia, unspecified: Secondary | ICD-10-CM | POA: Diagnosis not present

## 2019-06-10 DIAGNOSIS — I129 Hypertensive chronic kidney disease with stage 1 through stage 4 chronic kidney disease, or unspecified chronic kidney disease: Secondary | ICD-10-CM | POA: Diagnosis not present

## 2019-06-10 DIAGNOSIS — R600 Localized edema: Secondary | ICD-10-CM | POA: Diagnosis not present

## 2019-06-10 DIAGNOSIS — N390 Urinary tract infection, site not specified: Secondary | ICD-10-CM | POA: Diagnosis not present

## 2019-06-10 DIAGNOSIS — M5137 Other intervertebral disc degeneration, lumbosacral region: Secondary | ICD-10-CM | POA: Diagnosis not present

## 2019-06-10 DIAGNOSIS — R7301 Impaired fasting glucose: Secondary | ICD-10-CM | POA: Diagnosis not present

## 2019-06-10 DIAGNOSIS — F419 Anxiety disorder, unspecified: Secondary | ICD-10-CM | POA: Diagnosis not present

## 2019-06-10 DIAGNOSIS — D509 Iron deficiency anemia, unspecified: Secondary | ICD-10-CM | POA: Diagnosis not present

## 2019-06-10 DIAGNOSIS — E559 Vitamin D deficiency, unspecified: Secondary | ICD-10-CM | POA: Diagnosis not present

## 2019-06-10 DIAGNOSIS — N3010 Interstitial cystitis (chronic) without hematuria: Secondary | ICD-10-CM | POA: Diagnosis not present

## 2019-06-10 DIAGNOSIS — N183 Chronic kidney disease, stage 3 unspecified: Secondary | ICD-10-CM | POA: Diagnosis not present

## 2019-06-10 DIAGNOSIS — L89312 Pressure ulcer of right buttock, stage 2: Secondary | ICD-10-CM | POA: Diagnosis not present

## 2019-09-05 DIAGNOSIS — R600 Localized edema: Secondary | ICD-10-CM | POA: Diagnosis not present

## 2019-09-05 DIAGNOSIS — R5381 Other malaise: Secondary | ICD-10-CM | POA: Diagnosis not present

## 2019-09-05 DIAGNOSIS — J9601 Acute respiratory failure with hypoxia: Secondary | ICD-10-CM | POA: Diagnosis not present

## 2019-09-05 DIAGNOSIS — Z87440 Personal history of urinary (tract) infections: Secondary | ICD-10-CM | POA: Diagnosis not present

## 2019-09-05 DIAGNOSIS — R069 Unspecified abnormalities of breathing: Secondary | ICD-10-CM | POA: Diagnosis not present

## 2019-09-05 DIAGNOSIS — D6489 Other specified anemias: Secondary | ICD-10-CM | POA: Diagnosis not present

## 2019-09-05 DIAGNOSIS — Z993 Dependence on wheelchair: Secondary | ICD-10-CM | POA: Diagnosis not present

## 2019-09-05 DIAGNOSIS — J9611 Chronic respiratory failure with hypoxia: Secondary | ICD-10-CM | POA: Diagnosis not present

## 2019-09-05 DIAGNOSIS — F419 Anxiety disorder, unspecified: Secondary | ICD-10-CM | POA: Diagnosis not present

## 2019-09-05 DIAGNOSIS — R2681 Unsteadiness on feet: Secondary | ICD-10-CM | POA: Diagnosis not present

## 2019-09-05 DIAGNOSIS — J80 Acute respiratory distress syndrome: Secondary | ICD-10-CM | POA: Diagnosis not present

## 2019-09-05 DIAGNOSIS — Z66 Do not resuscitate: Secondary | ICD-10-CM | POA: Diagnosis not present

## 2019-09-05 DIAGNOSIS — F411 Generalized anxiety disorder: Secondary | ICD-10-CM | POA: Diagnosis not present

## 2019-09-05 DIAGNOSIS — R404 Transient alteration of awareness: Secondary | ICD-10-CM | POA: Diagnosis not present

## 2019-09-05 DIAGNOSIS — M7989 Other specified soft tissue disorders: Secondary | ICD-10-CM | POA: Diagnosis not present

## 2019-09-05 DIAGNOSIS — D649 Anemia, unspecified: Secondary | ICD-10-CM | POA: Diagnosis not present

## 2019-09-05 DIAGNOSIS — H409 Unspecified glaucoma: Secondary | ICD-10-CM | POA: Diagnosis not present

## 2019-09-05 DIAGNOSIS — J181 Lobar pneumonia, unspecified organism: Secondary | ICD-10-CM | POA: Diagnosis not present

## 2019-09-05 DIAGNOSIS — R0902 Hypoxemia: Secondary | ICD-10-CM | POA: Diagnosis not present

## 2019-09-05 DIAGNOSIS — I2510 Atherosclerotic heart disease of native coronary artery without angina pectoris: Secondary | ICD-10-CM | POA: Diagnosis not present

## 2019-09-05 DIAGNOSIS — B354 Tinea corporis: Secondary | ICD-10-CM | POA: Diagnosis not present

## 2019-09-05 DIAGNOSIS — M25571 Pain in right ankle and joints of right foot: Secondary | ICD-10-CM | POA: Diagnosis not present

## 2019-09-05 DIAGNOSIS — Z7401 Bed confinement status: Secondary | ICD-10-CM | POA: Diagnosis not present

## 2019-09-05 DIAGNOSIS — E872 Acidosis: Secondary | ICD-10-CM | POA: Diagnosis not present

## 2019-09-05 DIAGNOSIS — Z792 Long term (current) use of antibiotics: Secondary | ICD-10-CM | POA: Diagnosis not present

## 2019-09-05 DIAGNOSIS — J45909 Unspecified asthma, uncomplicated: Secondary | ICD-10-CM | POA: Diagnosis not present

## 2019-09-05 DIAGNOSIS — Z79899 Other long term (current) drug therapy: Secondary | ICD-10-CM | POA: Diagnosis not present

## 2019-09-05 DIAGNOSIS — I10 Essential (primary) hypertension: Secondary | ICD-10-CM | POA: Diagnosis not present

## 2019-09-05 DIAGNOSIS — M79671 Pain in right foot: Secondary | ICD-10-CM | POA: Diagnosis not present

## 2019-09-05 DIAGNOSIS — K219 Gastro-esophageal reflux disease without esophagitis: Secondary | ICD-10-CM | POA: Diagnosis not present

## 2019-09-05 DIAGNOSIS — R262 Difficulty in walking, not elsewhere classified: Secondary | ICD-10-CM | POA: Diagnosis not present

## 2019-09-05 DIAGNOSIS — M1990 Unspecified osteoarthritis, unspecified site: Secondary | ICD-10-CM | POA: Diagnosis not present

## 2019-09-05 DIAGNOSIS — R0602 Shortness of breath: Secondary | ICD-10-CM | POA: Diagnosis not present

## 2019-09-05 DIAGNOSIS — M2550 Pain in unspecified joint: Secondary | ICD-10-CM | POA: Diagnosis not present

## 2019-09-05 DIAGNOSIS — J9602 Acute respiratory failure with hypercapnia: Secondary | ICD-10-CM | POA: Diagnosis not present

## 2019-09-05 DIAGNOSIS — J189 Pneumonia, unspecified organism: Secondary | ICD-10-CM | POA: Diagnosis not present

## 2019-09-05 DIAGNOSIS — I129 Hypertensive chronic kidney disease with stage 1 through stage 4 chronic kidney disease, or unspecified chronic kidney disease: Secondary | ICD-10-CM | POA: Diagnosis not present

## 2019-09-05 DIAGNOSIS — N183 Chronic kidney disease, stage 3 unspecified: Secondary | ICD-10-CM | POA: Diagnosis not present

## 2019-09-11 DIAGNOSIS — Z515 Encounter for palliative care: Secondary | ICD-10-CM | POA: Diagnosis not present

## 2019-09-11 DIAGNOSIS — N390 Urinary tract infection, site not specified: Secondary | ICD-10-CM | POA: Diagnosis not present

## 2019-09-11 DIAGNOSIS — A0472 Enterocolitis due to Clostridium difficile, not specified as recurrent: Secondary | ICD-10-CM | POA: Diagnosis not present

## 2019-09-11 DIAGNOSIS — G9341 Metabolic encephalopathy: Secondary | ICD-10-CM | POA: Diagnosis not present

## 2019-09-11 DIAGNOSIS — R918 Other nonspecific abnormal finding of lung field: Secondary | ICD-10-CM | POA: Diagnosis not present

## 2019-09-11 DIAGNOSIS — R05 Cough: Secondary | ICD-10-CM | POA: Diagnosis not present

## 2019-09-11 DIAGNOSIS — R601 Generalized edema: Secondary | ICD-10-CM | POA: Diagnosis not present

## 2019-09-11 DIAGNOSIS — R410 Disorientation, unspecified: Secondary | ICD-10-CM | POA: Diagnosis not present

## 2019-09-11 DIAGNOSIS — G9340 Encephalopathy, unspecified: Secondary | ICD-10-CM | POA: Diagnosis not present

## 2019-09-11 DIAGNOSIS — Z87440 Personal history of urinary (tract) infections: Secondary | ICD-10-CM | POA: Diagnosis not present

## 2019-09-11 DIAGNOSIS — J189 Pneumonia, unspecified organism: Secondary | ICD-10-CM | POA: Diagnosis not present

## 2019-09-11 DIAGNOSIS — F411 Generalized anxiety disorder: Secondary | ICD-10-CM | POA: Diagnosis not present

## 2019-09-11 DIAGNOSIS — R0902 Hypoxemia: Secondary | ICD-10-CM | POA: Diagnosis not present

## 2019-09-11 DIAGNOSIS — Z66 Do not resuscitate: Secondary | ICD-10-CM | POA: Diagnosis not present

## 2019-09-11 DIAGNOSIS — N1831 Chronic kidney disease, stage 3a: Secondary | ICD-10-CM | POA: Diagnosis not present

## 2019-09-11 DIAGNOSIS — A419 Sepsis, unspecified organism: Secondary | ICD-10-CM | POA: Diagnosis not present

## 2019-09-11 DIAGNOSIS — M1990 Unspecified osteoarthritis, unspecified site: Secondary | ICD-10-CM | POA: Diagnosis not present

## 2019-09-11 DIAGNOSIS — Z20822 Contact with and (suspected) exposure to covid-19: Secondary | ICD-10-CM | POA: Diagnosis not present

## 2019-09-11 DIAGNOSIS — A0100 Typhoid fever, unspecified: Secondary | ICD-10-CM | POA: Diagnosis not present

## 2019-09-11 DIAGNOSIS — K219 Gastro-esophageal reflux disease without esophagitis: Secondary | ICD-10-CM | POA: Diagnosis not present

## 2019-09-11 DIAGNOSIS — N3001 Acute cystitis with hematuria: Secondary | ICD-10-CM | POA: Diagnosis not present

## 2019-09-11 DIAGNOSIS — N179 Acute kidney failure, unspecified: Secondary | ICD-10-CM | POA: Diagnosis not present

## 2019-09-11 DIAGNOSIS — R000 Tachycardia, unspecified: Secondary | ICD-10-CM | POA: Diagnosis not present

## 2019-09-11 DIAGNOSIS — J9611 Chronic respiratory failure with hypoxia: Secondary | ICD-10-CM | POA: Diagnosis not present

## 2019-09-11 DIAGNOSIS — H409 Unspecified glaucoma: Secondary | ICD-10-CM | POA: Diagnosis not present

## 2019-09-11 DIAGNOSIS — M25571 Pain in right ankle and joints of right foot: Secondary | ICD-10-CM | POA: Diagnosis not present

## 2019-09-11 DIAGNOSIS — J9602 Acute respiratory failure with hypercapnia: Secondary | ICD-10-CM | POA: Diagnosis not present

## 2019-09-11 DIAGNOSIS — J9811 Atelectasis: Secondary | ICD-10-CM | POA: Diagnosis not present

## 2019-09-11 DIAGNOSIS — M2550 Pain in unspecified joint: Secondary | ICD-10-CM | POA: Diagnosis not present

## 2019-09-11 DIAGNOSIS — R2681 Unsteadiness on feet: Secondary | ICD-10-CM | POA: Diagnosis not present

## 2019-09-11 DIAGNOSIS — J45909 Unspecified asthma, uncomplicated: Secondary | ICD-10-CM | POA: Diagnosis not present

## 2019-09-11 DIAGNOSIS — R5381 Other malaise: Secondary | ICD-10-CM | POA: Diagnosis not present

## 2019-09-11 DIAGNOSIS — H5704 Mydriasis: Secondary | ICD-10-CM | POA: Diagnosis not present

## 2019-09-11 DIAGNOSIS — I10 Essential (primary) hypertension: Secondary | ICD-10-CM | POA: Diagnosis not present

## 2019-09-11 DIAGNOSIS — F13239 Sedative, hypnotic or anxiolytic dependence with withdrawal, unspecified: Secondary | ICD-10-CM | POA: Diagnosis not present

## 2019-09-11 DIAGNOSIS — E860 Dehydration: Secondary | ICD-10-CM | POA: Diagnosis not present

## 2019-09-11 DIAGNOSIS — B354 Tinea corporis: Secondary | ICD-10-CM | POA: Diagnosis not present

## 2019-09-11 DIAGNOSIS — A009 Cholera, unspecified: Secondary | ICD-10-CM | POA: Diagnosis not present

## 2019-09-11 DIAGNOSIS — E46 Unspecified protein-calorie malnutrition: Secondary | ICD-10-CM | POA: Diagnosis not present

## 2019-09-11 DIAGNOSIS — Z981 Arthrodesis status: Secondary | ICD-10-CM | POA: Diagnosis not present

## 2019-09-11 DIAGNOSIS — F05 Delirium due to known physiological condition: Secondary | ICD-10-CM | POA: Diagnosis not present

## 2019-09-11 DIAGNOSIS — D649 Anemia, unspecified: Secondary | ICD-10-CM | POA: Diagnosis not present

## 2019-09-11 DIAGNOSIS — I2510 Atherosclerotic heart disease of native coronary artery without angina pectoris: Secondary | ICD-10-CM | POA: Diagnosis not present

## 2019-09-11 DIAGNOSIS — I472 Ventricular tachycardia: Secondary | ICD-10-CM | POA: Diagnosis not present

## 2019-09-11 DIAGNOSIS — I129 Hypertensive chronic kidney disease with stage 1 through stage 4 chronic kidney disease, or unspecified chronic kidney disease: Secondary | ICD-10-CM | POA: Diagnosis not present

## 2019-09-11 DIAGNOSIS — A001 Cholera due to Vibrio cholerae 01, biovar eltor: Secondary | ICD-10-CM | POA: Diagnosis not present

## 2019-09-11 DIAGNOSIS — B9689 Other specified bacterial agents as the cause of diseases classified elsewhere: Secondary | ICD-10-CM | POA: Diagnosis not present

## 2019-09-11 DIAGNOSIS — R0602 Shortness of breath: Secondary | ICD-10-CM | POA: Diagnosis not present

## 2019-09-11 DIAGNOSIS — R0689 Other abnormalities of breathing: Secondary | ICD-10-CM | POA: Diagnosis not present

## 2019-09-11 DIAGNOSIS — F419 Anxiety disorder, unspecified: Secondary | ICD-10-CM | POA: Diagnosis not present

## 2019-09-11 DIAGNOSIS — R319 Hematuria, unspecified: Secondary | ICD-10-CM | POA: Diagnosis not present

## 2019-09-11 DIAGNOSIS — R404 Transient alteration of awareness: Secondary | ICD-10-CM | POA: Diagnosis not present

## 2019-09-11 DIAGNOSIS — E876 Hypokalemia: Secondary | ICD-10-CM | POA: Diagnosis not present

## 2019-09-11 DIAGNOSIS — J9601 Acute respiratory failure with hypoxia: Secondary | ICD-10-CM | POA: Diagnosis not present

## 2019-09-11 DIAGNOSIS — R569 Unspecified convulsions: Secondary | ICD-10-CM | POA: Diagnosis not present

## 2019-09-11 DIAGNOSIS — Z7401 Bed confinement status: Secondary | ICD-10-CM | POA: Diagnosis not present

## 2019-09-11 DIAGNOSIS — R6520 Severe sepsis without septic shock: Secondary | ICD-10-CM | POA: Diagnosis not present

## 2019-09-11 DIAGNOSIS — Z8701 Personal history of pneumonia (recurrent): Secondary | ICD-10-CM | POA: Diagnosis not present

## 2019-09-11 DIAGNOSIS — R69 Illness, unspecified: Secondary | ICD-10-CM | POA: Diagnosis not present

## 2019-09-11 DIAGNOSIS — A000 Cholera due to Vibrio cholerae 01, biovar cholerae: Secondary | ICD-10-CM | POA: Diagnosis not present

## 2019-09-14 DIAGNOSIS — E860 Dehydration: Secondary | ICD-10-CM | POA: Diagnosis not present

## 2019-09-14 DIAGNOSIS — E46 Unspecified protein-calorie malnutrition: Secondary | ICD-10-CM | POA: Diagnosis not present

## 2019-09-14 DIAGNOSIS — R000 Tachycardia, unspecified: Secondary | ICD-10-CM | POA: Diagnosis not present

## 2019-09-14 DIAGNOSIS — R0902 Hypoxemia: Secondary | ICD-10-CM | POA: Diagnosis not present

## 2019-09-14 DIAGNOSIS — R2681 Unsteadiness on feet: Secondary | ICD-10-CM | POA: Diagnosis not present

## 2019-09-14 DIAGNOSIS — R410 Disorientation, unspecified: Secondary | ICD-10-CM | POA: Diagnosis not present

## 2019-09-14 DIAGNOSIS — N1831 Chronic kidney disease, stage 3a: Secondary | ICD-10-CM | POA: Diagnosis not present

## 2019-09-16 ENCOUNTER — Inpatient Hospital Stay (HOSPITAL_COMMUNITY)
Admission: EM | Admit: 2019-09-16 | Discharge: 2019-10-02 | DRG: 871 | Disposition: E | Payer: PPO | Source: Skilled Nursing Facility | Attending: Internal Medicine | Admitting: Internal Medicine

## 2019-09-16 ENCOUNTER — Other Ambulatory Visit: Payer: Self-pay

## 2019-09-16 ENCOUNTER — Encounter (HOSPITAL_COMMUNITY): Payer: Self-pay | Admitting: *Deleted

## 2019-09-16 ENCOUNTER — Inpatient Hospital Stay (HOSPITAL_COMMUNITY): Payer: PPO

## 2019-09-16 ENCOUNTER — Emergency Department (HOSPITAL_COMMUNITY): Payer: PPO

## 2019-09-16 DIAGNOSIS — J45909 Unspecified asthma, uncomplicated: Secondary | ICD-10-CM | POA: Diagnosis not present

## 2019-09-16 DIAGNOSIS — H5704 Mydriasis: Secondary | ICD-10-CM | POA: Diagnosis not present

## 2019-09-16 DIAGNOSIS — R569 Unspecified convulsions: Secondary | ICD-10-CM | POA: Diagnosis not present

## 2019-09-16 DIAGNOSIS — N281 Cyst of kidney, acquired: Secondary | ICD-10-CM | POA: Diagnosis not present

## 2019-09-16 DIAGNOSIS — R0689 Other abnormalities of breathing: Secondary | ICD-10-CM | POA: Diagnosis not present

## 2019-09-16 DIAGNOSIS — N179 Acute kidney failure, unspecified: Secondary | ICD-10-CM | POA: Diagnosis not present

## 2019-09-16 DIAGNOSIS — G319 Degenerative disease of nervous system, unspecified: Secondary | ICD-10-CM | POA: Diagnosis not present

## 2019-09-16 DIAGNOSIS — J90 Pleural effusion, not elsewhere classified: Secondary | ICD-10-CM | POA: Diagnosis not present

## 2019-09-16 DIAGNOSIS — G9341 Metabolic encephalopathy: Secondary | ICD-10-CM | POA: Diagnosis not present

## 2019-09-16 DIAGNOSIS — Z881 Allergy status to other antibiotic agents status: Secondary | ICD-10-CM

## 2019-09-16 DIAGNOSIS — E860 Dehydration: Secondary | ICD-10-CM | POA: Diagnosis present

## 2019-09-16 DIAGNOSIS — G9340 Encephalopathy, unspecified: Secondary | ICD-10-CM | POA: Diagnosis not present

## 2019-09-16 DIAGNOSIS — F419 Anxiety disorder, unspecified: Secondary | ICD-10-CM | POA: Diagnosis not present

## 2019-09-16 DIAGNOSIS — Z87440 Personal history of urinary (tract) infections: Secondary | ICD-10-CM | POA: Diagnosis not present

## 2019-09-16 DIAGNOSIS — Z515 Encounter for palliative care: Secondary | ICD-10-CM | POA: Diagnosis not present

## 2019-09-16 DIAGNOSIS — I10 Essential (primary) hypertension: Secondary | ICD-10-CM | POA: Diagnosis not present

## 2019-09-16 DIAGNOSIS — R0602 Shortness of breath: Secondary | ICD-10-CM | POA: Diagnosis not present

## 2019-09-16 DIAGNOSIS — J9811 Atelectasis: Secondary | ICD-10-CM | POA: Diagnosis not present

## 2019-09-16 DIAGNOSIS — Z7901 Long term (current) use of anticoagulants: Secondary | ICD-10-CM

## 2019-09-16 DIAGNOSIS — R6520 Severe sepsis without septic shock: Secondary | ICD-10-CM | POA: Diagnosis not present

## 2019-09-16 DIAGNOSIS — N3001 Acute cystitis with hematuria: Secondary | ICD-10-CM | POA: Diagnosis not present

## 2019-09-16 DIAGNOSIS — J9601 Acute respiratory failure with hypoxia: Secondary | ICD-10-CM | POA: Diagnosis not present

## 2019-09-16 DIAGNOSIS — R000 Tachycardia, unspecified: Secondary | ICD-10-CM | POA: Diagnosis not present

## 2019-09-16 DIAGNOSIS — Z66 Do not resuscitate: Secondary | ICD-10-CM | POA: Diagnosis not present

## 2019-09-16 DIAGNOSIS — R404 Transient alteration of awareness: Secondary | ICD-10-CM | POA: Diagnosis not present

## 2019-09-16 DIAGNOSIS — Z79899 Other long term (current) drug therapy: Secondary | ICD-10-CM

## 2019-09-16 DIAGNOSIS — Z452 Encounter for adjustment and management of vascular access device: Secondary | ICD-10-CM | POA: Diagnosis not present

## 2019-09-16 DIAGNOSIS — Z20822 Contact with and (suspected) exposure to covid-19: Secondary | ICD-10-CM | POA: Diagnosis not present

## 2019-09-16 DIAGNOSIS — R0902 Hypoxemia: Secondary | ICD-10-CM

## 2019-09-16 DIAGNOSIS — Z981 Arthrodesis status: Secondary | ICD-10-CM | POA: Diagnosis not present

## 2019-09-16 DIAGNOSIS — I7090 Unspecified atherosclerosis: Secondary | ICD-10-CM | POA: Diagnosis not present

## 2019-09-16 DIAGNOSIS — I472 Ventricular tachycardia: Secondary | ICD-10-CM | POA: Diagnosis not present

## 2019-09-16 DIAGNOSIS — Z8701 Personal history of pneumonia (recurrent): Secondary | ICD-10-CM | POA: Diagnosis not present

## 2019-09-16 DIAGNOSIS — R05 Cough: Secondary | ICD-10-CM | POA: Diagnosis not present

## 2019-09-16 DIAGNOSIS — N289 Disorder of kidney and ureter, unspecified: Secondary | ICD-10-CM | POA: Diagnosis not present

## 2019-09-16 DIAGNOSIS — N1831 Chronic kidney disease, stage 3a: Secondary | ICD-10-CM | POA: Diagnosis not present

## 2019-09-16 DIAGNOSIS — G9389 Other specified disorders of brain: Secondary | ICD-10-CM | POA: Diagnosis not present

## 2019-09-16 DIAGNOSIS — H409 Unspecified glaucoma: Secondary | ICD-10-CM | POA: Diagnosis present

## 2019-09-16 DIAGNOSIS — A419 Sepsis, unspecified organism: Secondary | ICD-10-CM | POA: Diagnosis not present

## 2019-09-16 DIAGNOSIS — F13931 Sedative, hypnotic or anxiolytic use, unspecified with withdrawal delirium: Secondary | ICD-10-CM

## 2019-09-16 DIAGNOSIS — R319 Hematuria, unspecified: Secondary | ICD-10-CM | POA: Diagnosis not present

## 2019-09-16 DIAGNOSIS — E876 Hypokalemia: Secondary | ICD-10-CM | POA: Diagnosis present

## 2019-09-16 DIAGNOSIS — I2510 Atherosclerotic heart disease of native coronary artery without angina pectoris: Secondary | ICD-10-CM | POA: Diagnosis present

## 2019-09-16 DIAGNOSIS — N2889 Other specified disorders of kidney and ureter: Secondary | ICD-10-CM | POA: Diagnosis not present

## 2019-09-16 DIAGNOSIS — F13239 Sedative, hypnotic or anxiolytic dependence with withdrawal, unspecified: Secondary | ICD-10-CM | POA: Diagnosis not present

## 2019-09-16 DIAGNOSIS — K219 Gastro-esophageal reflux disease without esophagitis: Secondary | ICD-10-CM | POA: Diagnosis present

## 2019-09-16 DIAGNOSIS — N390 Urinary tract infection, site not specified: Secondary | ICD-10-CM | POA: Diagnosis present

## 2019-09-16 DIAGNOSIS — A009 Cholera, unspecified: Secondary | ICD-10-CM | POA: Diagnosis not present

## 2019-09-16 DIAGNOSIS — Z882 Allergy status to sulfonamides status: Secondary | ICD-10-CM

## 2019-09-16 DIAGNOSIS — R918 Other nonspecific abnormal finding of lung field: Secondary | ICD-10-CM | POA: Diagnosis not present

## 2019-09-16 DIAGNOSIS — Z9981 Dependence on supplemental oxygen: Secondary | ICD-10-CM

## 2019-09-16 DIAGNOSIS — B9689 Other specified bacterial agents as the cause of diseases classified elsewhere: Secondary | ICD-10-CM | POA: Diagnosis not present

## 2019-09-16 DIAGNOSIS — I129 Hypertensive chronic kidney disease with stage 1 through stage 4 chronic kidney disease, or unspecified chronic kidney disease: Secondary | ICD-10-CM | POA: Diagnosis not present

## 2019-09-16 DIAGNOSIS — R059 Cough, unspecified: Secondary | ICD-10-CM

## 2019-09-16 DIAGNOSIS — A001 Cholera due to Vibrio cholerae 01, biovar eltor: Secondary | ICD-10-CM | POA: Diagnosis not present

## 2019-09-16 DIAGNOSIS — I6782 Cerebral ischemia: Secondary | ICD-10-CM | POA: Diagnosis not present

## 2019-09-16 DIAGNOSIS — A000 Cholera due to Vibrio cholerae 01, biovar cholerae: Secondary | ICD-10-CM | POA: Diagnosis not present

## 2019-09-16 DIAGNOSIS — Z888 Allergy status to other drugs, medicaments and biological substances status: Secondary | ICD-10-CM

## 2019-09-16 HISTORY — DX: Pneumonia, unspecified organism: J18.9

## 2019-09-16 HISTORY — DX: Essential (primary) hypertension: I10

## 2019-09-16 HISTORY — DX: Respiratory failure, unspecified, unspecified whether with hypoxia or hypercapnia: J96.90

## 2019-09-16 HISTORY — DX: Anxiety disorder, unspecified: F41.9

## 2019-09-16 HISTORY — DX: Unspecified asthma, uncomplicated: J45.909

## 2019-09-16 HISTORY — DX: Unspecified glaucoma: H40.9

## 2019-09-16 HISTORY — DX: Gastro-esophageal reflux disease without esophagitis: K21.9

## 2019-09-16 HISTORY — DX: Atherosclerotic heart disease of native coronary artery without angina pectoris: I25.10

## 2019-09-16 HISTORY — DX: Disorder of kidney and ureter, unspecified: N28.9

## 2019-09-16 LAB — URINALYSIS, ROUTINE W REFLEX MICROSCOPIC
Bilirubin Urine: NEGATIVE
Glucose, UA: NEGATIVE mg/dL
Ketones, ur: 5 mg/dL — AB
Nitrite: NEGATIVE
Protein, ur: >=300 mg/dL — AB
Specific Gravity, Urine: 1.014 (ref 1.005–1.030)
WBC, UA: >50 WBC/hpf — ABNORMAL HIGH (ref 0–5)
pH: 5 (ref 5.0–8.0)

## 2019-09-16 LAB — CBC WITH DIFFERENTIAL/PLATELET
Abs Immature Granulocytes: 0.1 10*3/uL — ABNORMAL HIGH (ref 0.00–0.07)
Basophils Absolute: 0.1 10*3/uL (ref 0.0–0.1)
Basophils Relative: 0 %
Eosinophils Absolute: 0.1 10*3/uL (ref 0.0–0.5)
Eosinophils Relative: 0 %
HCT: 37.5 % (ref 36.0–46.0)
Hemoglobin: 11.4 g/dL — ABNORMAL LOW (ref 12.0–15.0)
Immature Granulocytes: 1 %
Lymphocytes Relative: 12 %
Lymphs Abs: 1.9 10*3/uL (ref 0.7–4.0)
MCH: 28.3 pg (ref 26.0–34.0)
MCHC: 30.4 g/dL (ref 30.0–36.0)
MCV: 93.1 fL (ref 80.0–100.0)
Monocytes Absolute: 1.7 10*3/uL — ABNORMAL HIGH (ref 0.1–1.0)
Monocytes Relative: 10 %
Neutro Abs: 12.4 10*3/uL — ABNORMAL HIGH (ref 1.7–7.7)
Neutrophils Relative %: 77 %
Platelets: 392 10*3/uL (ref 150–400)
RBC: 4.03 MIL/uL (ref 3.87–5.11)
RDW: 16.3 % — ABNORMAL HIGH (ref 11.5–15.5)
WBC: 16.3 10*3/uL — ABNORMAL HIGH (ref 4.0–10.5)
nRBC: 0 % (ref 0.0–0.2)

## 2019-09-16 LAB — COMPREHENSIVE METABOLIC PANEL
ALT: 17 U/L (ref 0–44)
AST: 42 U/L — ABNORMAL HIGH (ref 15–41)
Albumin: 2.6 g/dL — ABNORMAL LOW (ref 3.5–5.0)
Alkaline Phosphatase: 41 U/L (ref 38–126)
Anion gap: 10 (ref 5–15)
BUN: 25 mg/dL — ABNORMAL HIGH (ref 8–23)
CO2: 26 mmol/L (ref 22–32)
Calcium: 8.8 mg/dL — ABNORMAL LOW (ref 8.9–10.3)
Chloride: 110 mmol/L (ref 98–111)
Creatinine, Ser: 1.41 mg/dL — ABNORMAL HIGH (ref 0.44–1.00)
GFR calc Af Amer: 41 mL/min — ABNORMAL LOW (ref >60)
GFR calc non Af Amer: 35 mL/min — ABNORMAL LOW (ref >60)
Glucose, Bld: 106 mg/dL — ABNORMAL HIGH (ref 70–99)
Potassium: 4.5 mmol/L (ref 3.5–5.1)
Sodium: 146 mmol/L — ABNORMAL HIGH (ref 135–145)
Total Bilirubin: 1.5 mg/dL — ABNORMAL HIGH (ref 0.3–1.2)
Total Protein: 5.7 g/dL — ABNORMAL LOW (ref 6.5–8.1)

## 2019-09-16 LAB — LACTIC ACID, PLASMA: Lactic Acid, Venous: 1.4 mmol/L (ref 0.5–1.9)

## 2019-09-16 LAB — SARS CORONAVIRUS 2 BY RT PCR (HOSPITAL ORDER, PERFORMED IN ~~LOC~~ HOSPITAL LAB): SARS Coronavirus 2: NEGATIVE

## 2019-09-16 MED ORDER — PANTOPRAZOLE SODIUM 40 MG PO TBEC
40.0000 mg | DELAYED_RELEASE_TABLET | Freq: Every day | ORAL | Status: DC
  Administered 2019-09-17 – 2019-09-21 (×3): 40 mg via ORAL
  Filled 2019-09-16 (×8): qty 1

## 2019-09-16 MED ORDER — ACETAMINOPHEN 650 MG RE SUPP: 650.0000 mg | Freq: Four times a day (QID) | RECTAL | Status: DC | PRN

## 2019-09-16 MED ORDER — ENOXAPARIN SODIUM 40 MG/0.4ML ~~LOC~~ SOLN
40.0000 mg | SUBCUTANEOUS | Status: DC
  Administered 2019-09-16 – 2019-09-20 (×5): 40 mg via SUBCUTANEOUS
  Filled 2019-09-16 (×5): qty 0.4

## 2019-09-16 MED ORDER — VANCOMYCIN HCL IN DEXTROSE 1-5 GM/200ML-% IV SOLN: 1000.0000 mg | Freq: Once | INTRAVENOUS | Status: DC

## 2019-09-16 MED ORDER — VANCOMYCIN HCL 750 MG/150ML IV SOLN
750.0000 mg | Freq: Two times a day (BID) | INTRAVENOUS | Status: DC
  Administered 2019-09-17 – 2019-09-19 (×6): 750 mg via INTRAVENOUS
  Filled 2019-09-16 (×7): qty 150

## 2019-09-16 MED ORDER — ACETAMINOPHEN 325 MG PO TABS: 650.0000 mg | ORAL_TABLET | Freq: Four times a day (QID) | ORAL | Status: DC | PRN

## 2019-09-16 MED ORDER — SODIUM CHLORIDE 0.9 % IV SOLN
2.0000 g | Freq: Once | INTRAVENOUS | Status: AC
  Administered 2019-09-16: 2 g via INTRAVENOUS
  Filled 2019-09-16: qty 2

## 2019-09-16 MED ORDER — SODIUM CHLORIDE 0.9 % IV SOLN
2.0000 g | Freq: Two times a day (BID) | INTRAVENOUS | Status: DC
  Administered 2019-09-17 – 2019-09-18 (×4): 2 g via INTRAVENOUS
  Filled 2019-09-16 (×4): qty 2

## 2019-09-16 MED ORDER — LATANOPROST 0.005 % OP SOLN
1.0000 [drp] | Freq: Every day | OPHTHALMIC | Status: DC
  Administered 2019-09-17 – 2019-09-24 (×7): 1 [drp] via OPHTHALMIC
  Filled 2019-09-16: qty 2.5

## 2019-09-16 MED ORDER — VANCOMYCIN HCL 1500 MG/300ML IV SOLN
1500.0000 mg | Freq: Once | INTRAVENOUS | Status: AC
  Administered 2019-09-16: 1500 mg via INTRAVENOUS
  Filled 2019-09-16: qty 300

## 2019-09-16 MED ORDER — LACTATED RINGERS IV SOLN: INTRAVENOUS | Status: DC

## 2019-09-16 MED ORDER — LACTATED RINGERS IV BOLUS
1000.0000 mL | Freq: Once | INTRAVENOUS | Status: AC
  Administered 2019-09-16: 1000 mL via INTRAVENOUS

## 2019-09-16 MED ORDER — CLONIDINE HCL 0.1 MG PO TABS
0.1000 mg | ORAL_TABLET | Freq: Every day | ORAL | Status: DC
  Administered 2019-09-16 – 2019-09-21 (×5): 0.1 mg via ORAL
  Filled 2019-09-16 (×6): qty 1

## 2019-09-16 MED ORDER — ADULT MULTIVITAMIN W/MINERALS CH
1.0000 | ORAL_TABLET | Freq: Every day | ORAL | Status: DC
  Administered 2019-09-17 – 2019-09-21 (×3): 1 via ORAL
  Filled 2019-09-16 (×8): qty 1

## 2019-09-16 MED ORDER — SODIUM CHLORIDE 0.9% FLUSH
3.0000 mL | Freq: Two times a day (BID) | INTRAVENOUS | Status: DC
  Administered 2019-09-17 – 2019-09-23 (×10): 3 mL via INTRAVENOUS

## 2019-09-16 NOTE — ED Notes (Signed)
Daughter at  The bedside now

## 2019-09-16 NOTE — ED Notes (Signed)
Cath urine thick white urine draining  The pt is covered in bruises  Especially her arms  She responds to pain  Otherwise does not answer questions asked

## 2019-09-16 NOTE — ED Notes (Signed)
Blue top tube to be redrawn it clotted  Iv running in the only vein available

## 2019-09-16 NOTE — ED Notes (Signed)
The pt appears to be more alert answering questions asked

## 2019-09-16 NOTE — H&P (Signed)
Date: 10/16/19               Patient Name:  Judy Mooney MRN: [id number]  DOB: 1940/03/14 Age / Sex: 79 y.o., female   PCP: System, Pcp Not In         Medical Service: Internal Medicine Teaching Service         Attending Physician: Dr. Mikey Bussing, Marthenia Rolling, DO    First Contact: Dr. Merrilyn Puma Pager: [phone]  Second Contact: Dr. Hermine Messick Pager: [phone]       After Hours (After 5p/  First Contact Pager: [phone]  weekends / holidays): Second Contact Pager: [phone]   Chief Complaint: Shortness of breath  History of Present Illness: Judy Mooney is an 79 year old female with past medical history significant for chronic kidney disease stage 3, hypertension, CAD, and chronic recurrent UTIs (on Keflex for prophylaxis) with recent hospitalization from 7/5-7/11 for community-aquired-pneumonia who presented to [facility] ED from [facility] for worsening confusion, hypoxia and shortness of breath.  Patient was admitted to [facility] on 7/5 for pneumonia. She received zithromax and cefuorxime, and was discharged on 7/11 to [facility] for rehabilitation of an unsteady gait. While at the facility, she was noted to have poor appetite with limited food and fluid intake, as well as hypoxia requiring supplemental oxygen. CXR and d-dimer were obtained at the facility which were both normal and dopplers were obtained due to concern of pulmonary embolism. She was started on Eliquis for empiric treatment. Today, her hypoxia worsened, EMS was called and she was placed on a nonrebreather.  On arrival to ED, patient was confused, tachycardic, tachypneic with a rectal temperature of 100.3. Code sepsis was initiated. CMP revealed Na 146, Cr 1.4; CBC revealed WBC 16,000; Lactic acid 1.4; urinalysis revealed leukocytes greater than 50, many bacteria with clumps of WBCs; CXR showed hypoinflation but no localizing symptoms. She was started on IV fluids, vancomycin,  cefepime and blood and urine cultures obtained. Admitted to IMTS for sepsis and further care.  Meds:  Current Meds  Medication Sig  . acetaminophen (TYLENOL) 325 MG tablet Take 650 mg by mouth every 6 (six) hours as needed for mild pain or fever.  Marland Kitchen albuterol (VENTOLIN HFA) 108 (90 Base) MCG/ACT inhaler Inhale 2 puffs into the lungs every 6 (six) hours as needed for wheezing or shortness of breath.  . ALPRAZolam (XANAX XR) 0.5 MG 24 hr tablet Take 0.5 mg by mouth 2 (two) times daily as needed for anxiety.  . ALPRAZolam (XANAX) 0.25 MG tablet Take 0.25 mg by mouth 3 (three) times daily as needed for anxiety.  Marland Kitchen apixaban (ELIQUIS) 5 MG TABS tablet Take 10 mg by mouth 2 (two) times daily.  . benzonatate (TESSALON) 100 MG capsule Take 100 mg by mouth 3 (three) times daily as needed for cough.  . cefUROXime (CEFTIN) 500 MG tablet Take 500 mg by mouth 2 (two) times daily with a meal.  . cloNIDine (CATAPRES) 0.1 MG tablet Take 0.1 mg by mouth at bedtime.  Marland Kitchen guaifenesin (ROBITUSSIN) 100 MG/5ML syrup Take 200 mg by mouth 3 (three) times daily as needed for cough.  Marland Kitchen ipratropium-albuterol (DUONEB) 0.5-2.5 (3) MG/3ML SOLN Inhale 3 mLs into the lungs every 6 (six) hours as needed (wheezing).  Marland Kitchen latanoprost (XALATAN) 0.005 % ophthalmic solution Place 1 drop into both eyes at bedtime.  . NON FORMULARY Take 1 each by mouth in the morning and at bedtime. Magic Cup -  nutritional suppliment  . NON FORMULARY Take 90 mLs by mouth in the morning and at bedtime. MedPass- nutritional suppliment  . nystatin cream (MYCOSTATIN) Apply 1 application topically 2 (two) times daily. Apply to under breasts.  Marland Kitchen omeprazole (PRILOSEC) 20 MG capsule Take 20 mg by mouth daily.  . OXYGEN Inhale 2 L into the lungs continuous.  . traMADol (ULTRAM) 50 MG tablet Take 50 mg by mouth daily as needed for moderate pain.    Allergies: Allergies as of 09/19/2019 - Review Complete 09/05/2019  Allergen Reaction Noted  . Sulfa antibiotics   09/12/2019  . Tetracyclines & related  09/05/2019  . Trimethoprim  09/30/2019   Past Medical History:  Diagnosis Date  . Anxiety   . Asthma   . Coronary artery disease   . GERD (gastroesophageal reflux disease)   . Glaucoma   . Hypertension   . Pneumonia   . Renal disorder   . Respiratory failure (HCC)     Family History: There is no family history of diabetes mellitus, colon cancer or breast cancer.  Social History: She is married. She has one daughter. She has no history of tobacco or alcohol abuse.  Review of Systems: A complete ROS could not be performed due to the acuity of her condition.  Physical Exam: Blood pressure (!) 147/115, pulse (!) 117, temperature 98.7 F (37.1 C), resp. rate (!) 28, weight 83.8 kg, SpO2 100 %. Physical Exam Constitutional:      General: She is not in acute distress.    Appearance: She is ill-appearing.  HENT:     Head: Normocephalic and atraumatic.     Mouth/Throat:     Mouth: Mucous membranes are dry.  Cardiovascular:     Rate and Rhythm: Tachycardia present.     Heart sounds: Normal heart sounds. No murmur heard.  No gallop.   Pulmonary:     Effort: Tachypnea present.     Breath sounds: Examination of the right-lower field reveals rhonchi. Decreased breath sounds and rhonchi present.  Abdominal:     Palpations: Abdomen is soft.     Tenderness: There is no abdominal tenderness. There is no guarding or rebound.  Musculoskeletal:        General: Normal range of motion.  Skin:    General: Skin is warm and dry.     Findings: Ecchymosis present.  Neurological:     Mental Status: She is disoriented.     Comments: Patient disoriented to name, location and time.  Psychiatric:        Mood and Affect: Mood is not anxious.        Behavior: Behavior is not agitated.    EKG: personally reviewed my interpretation is sinus tachycardia.  CXR: personally reviewed my interpretation is lungs are clear with no consolidation, pneumothorax or  pleural effusion.  Assessment & Plan by Problem: Active Problems:   Sepsis (HCC)   Acute on chronic renal failure (HCC)  #Sepsis Patient with recent history of pneumonia and recurrent UTIs (on Keflex) presenting with low-grade fever, tachycardia, leukocytosis and many bacteria on urinalysis. Condition possibly secondary to UTI. Pyelonephritis remains a consideration, however patient does not have CVA tenderness on exam. Tachypneic with oxygen requirement while at facility, however she is satting well on room air now in the ED. CXR x2 were normal. If urine cultures return normal, keep pneumonia in mind. Received vancomycin and cefepmine while in the ED. PLAN: -Repeat CXR -Continue cefepime -Continue vancomycin -f/u blood and urine cultures -Repeat CBC  in AM  #Acute on chronic renal failure #Hypernatremia Baseline creatinine 0.90 on 09/10/19, creatinine today of 1.41. Sodium of 146. Patient refusing PO intake over past few days while at nursing center. PLAN: -IV fluids  #Hypoxia Patient required 2L [place] upon discharge from hospitalization with pneumonia. Hypoxic while at facility, however satting at 100% on room air currently. Pulmonary embolism was considered, however patient had normal D-dimer and was adherent to eliquis for past few days. Repeat CXR shows chronic lung disease. PLAN: -[place] O2 as needed  #Hyperbilirubinemia TBili 1.5. Unknown etiology. PLAN: -Repeat CMP in morning  #HTN PLAN: -Continue home clonidine  Dispo: Admit patient to Inpatient with expected length of stay greater than 2 midnights.  Signed: Jasmine December, MD 09/09/2019, 10:13 PM  Pager: [phone] After 5pm on weekdays and 1pm on weekends: On Call pager: [phone]

## 2019-09-16 NOTE — Progress Notes (Signed)
Pharmacy Antibiotic Note  Judy Mooney is a 79 y.o. female admitted on 09/12/2019 with pneumonia.  Pharmacy has been consulted for Cefepime and Vancomycin dosing.   Weight: 83.8 kg (184 lb 11.2 oz)  Temp (24hrs), Avg:100.3 F (37.9 C), Min:100.3 F (37.9 C), Max:100.3 F (37.9 C)  Recent Labs  Lab 09/29/2019 1705 09/28/2019 1725  WBC  --  16.3*  CREATININE  --  1.41*  LATICACIDVEN 1.4  --     CrCl cannot be calculated (Unknown ideal weight.).    Allergies  Allergen Reactions  . Sulfa Antibiotics   . Tetracyclines & Related   . Trimethoprim     Antimicrobials this admission: 7/16 Cefepime >>  7/16 Vancomycin >>   Dose adjustments this admission: N/a  Microbiology results: Pending   Plan:  - Cefepime 2g IV q12h - Vancomycin 1500mg  IV x 1 dose  - Followed by Vancomycin 750mg  IV q12h (nomogram dosing) - Monitor patients renal function and urine output  - De-escalate ABX when appropriate   Thank you for allowing pharmacy to be a part of this patient's care.  PharmD. BCPS 09/23/2019 6:45 PM

## 2019-09-16 NOTE — ED Notes (Signed)
To x-ray

## 2019-09-16 NOTE — ED Notes (Signed)
No history in our hospital records

## 2019-09-16 NOTE — ED Notes (Signed)
Iv team at  The bedside 

## 2019-09-16 NOTE — ED Provider Notes (Signed)
[facility] EMERGENCY DEPARTMENT Provider Note   CSN: [id number] Arrival date & time: 2019-09-19  1615     History Chief Complaint  Patient presents with  . Shortness of Breath    Judy Mooney is a 79 y.o. female.  Patient is an 79 year old female with a history of chronic kidney disease, hypertension, recurrent UTIs on Keflex for prophylaxis with recent hospitalization from July 5 to September 11, 2019 for community-acquired pneumonia completing a course of Zithromax and cefuroxime who has been in rehab now since July 11 returning today due to worsening confusion, hypoxia and shortness of breath.  Patient since arriving at the facility has had persistent poor appetite with little food and fluid intake with concern for dehydration.  She continued to have persistent hypoxia that required supplemental oxygen at the facility that was not felt to be related to the recent pneumonia given clearing of her chest x-ray and she was started prophylactically on Eliquis several days ago while she was waiting to get a D-dimer and Dopplers.  Patient has had persistent tachycardia since her arrival at the facility and has had ongoing confusion which was unclear whether from Alzheimer's or need for supplemental oxygen which the patient does not usually want to wear.  Prior to patient's hospitalization she was coming from home.  Today patient is able to answer questions but continually just states she does not know.  Patient has completed her antibiotics approximately 4 days ago.  Patient at this time is not complaining of any localized pain but grimaces and groans.  When EMS arrived they reported patient was hypoxic and placed on a nonrebreather.  Persistent tachycardia with normal blood pressure.  The history is provided by the nursing home, the EMS personnel and medical records. The history is limited by the absence of a caregiver and the condition of the patient.  Shortness of Breath      No  past medical history on file.  There are no problems to display for this patient.      OB History   No obstetric history on file.     No family history on file.  Social History   Tobacco Use  . Smoking status: Not on file  Substance Use Topics  . Alcohol use: Not on file  . Drug use: Not on file    Home Medications Prior to Admission medications   Not on File    Allergies    Patient has no allergy information on record.  Review of Systems   Review of Systems  Unable to perform ROS: Acuity of condition  Respiratory: Positive for shortness of breath.     Physical Exam Updated Vital Signs There were no vitals taken for this visit.  Physical Exam Vitals and nursing note reviewed.  Constitutional:      General: She is in acute distress.     Appearance: She is well-developed. She is ill-appearing.     Comments: Patient appears unwell  HENT:     Head: Normocephalic and atraumatic.     Mouth/Throat:     Mouth: Mucous membranes are dry.  Eyes:     Conjunctiva/sclera: Conjunctivae normal.     Pupils: Pupils are equal, round, and reactive to light.  Cardiovascular:     Rate and Rhythm: Regular rhythm. Tachycardia present.     Pulses: Normal pulses.     Heart sounds: No murmur heard.   Pulmonary:     Effort: Pulmonary effort is normal. Tachypnea present. No  respiratory distress.     Breath sounds: Examination of the right-lower field reveals rhonchi. Decreased breath sounds and rhonchi present. No wheezing or rales.  Abdominal:     General: There is no distension.     Palpations: Abdomen is soft.     Tenderness: There is abdominal tenderness. There is no guarding or rebound.     Comments: Epigastric tenderness with palpation but patient winces when touching anywhere on her body  Musculoskeletal:        General: No tenderness. Normal range of motion.     Cervical back: Normal range of motion and neck supple.  Skin:    General: Skin is warm and dry.      Findings: No erythema or rash.     Comments: Poor skin turgor with multiple areas of ecchymosis all over all 4 extremities in various stages of healing.  Healing decubitus wound without signs of erythema or drainage over the sacrum  Neurological:     Mental Status: She is alert.     Comments: Patient is able to respond to questions but may not be accurate.  Cannot give much history but is moving all extremities and has no evidence of facial droop.  Psychiatric:     Comments: Calm but not able to give any information.  At this time does not appear to be responding to internal stimuli.     ED Results / Procedures / Treatments   Labs (all labs ordered are listed, but only abnormal results are displayed) Labs Reviewed  COMPREHENSIVE METABOLIC PANEL - Abnormal; Notable for the following components:      Result Value   Sodium 146 (*)    Glucose, Bld 106 (*)    BUN 25 (*)    Creatinine, Ser 1.41 (*)    Calcium 8.8 (*)    Total Protein 5.7 (*)    Albumin 2.6 (*)    AST 42 (*)    Total Bilirubin 1.5 (*)    GFR calc non Af Amer 35 (*)    GFR calc Af Amer 41 (*)    All other components within normal limits  CBC WITH DIFFERENTIAL/PLATELET - Abnormal; Notable for the following components:   WBC 16.3 (*)    Hemoglobin 11.4 (*)    RDW 16.3 (*)    Neutro Abs 12.4 (*)    Monocytes Absolute 1.7 (*)    Abs Immature Granulocytes 0.10 (*)    All other components within normal limits  URINALYSIS, ROUTINE W REFLEX MICROSCOPIC - Abnormal; Notable for the following components:   APPearance TURBID (*)    Hgb urine dipstick MODERATE (*)    Ketones, ur 5 (*)    Protein, ur >=300 (*)    Leukocytes,Ua MODERATE (*)    WBC, UA >50 (*)    Bacteria, UA MANY (*)    All other components within normal limits  CULTURE, BLOOD (ROUTINE X 2)  CULTURE, BLOOD (ROUTINE X 2)  URINE CULTURE  SARS CORONAVIRUS 2 BY RT PCR (HOSPITAL ORDER, PERFORMED IN [facility] LAB)  LACTIC ACID, PLASMA  LACTIC ACID,  PLASMA    EKG EKG Interpretation  Date/Time:  Friday September 16 2019 16:39:56 EDT Ventricular Rate:  110 PR Interval:    QRS Duration: 87 QT Interval:  336 QTC Calculation: 455 R Axis:   -25 Text Interpretation: Sinus tachycardia Borderline left axis deviation Low voltage, precordial leads Abnormal R-wave progression, early transition Artifact Confirmed by Gwyneth Sprout ([id number]) on 09/21/2019 4:44:43 PM  Radiology DG Chest Port 1 View  Result Date: 2019-03-17 CLINICAL DATA:  Cough, hypoxia EXAM: PORTABLE CHEST 1 VIEW COMPARISON:  09/05/2019 FINDINGS: Lung volumes are extremely small with unchanged asymmetric right-sided volume loss and mild elevation of the right hemidiaphragm. Previously noted pulmonary nodules no longer visualized, though this may be obscured by poor pulmonary insufflation. Lungs are clear. No pneumothorax or pleural effusion. Cardiac size within normal limits. No acute bone abnormality. IMPRESSION: Pulmonary hypoinflation. Electronically Signed   By: Helyn NumbersAshesh  Parikh MD   On: 02021-01-14 17:02    Procedures Procedures (including critical care time)  Medications Ordered in ED Medications  vancomycin (VANCOCIN) IVPB 1000 mg/200 mL premix (has no administration in time range)  ceFEPIme (MAXIPIME) 2 g in sodium chloride 0.9 % 100 mL IVPB (has no administration in time range)    ED Course  I have reviewed the triage vital signs and the nursing notes.  Pertinent labs & imaging results that were available during my care of the patient were reviewed by me and considered in my medical decision making (see chart for details).    MDM Rules/Calculators/A&P                          Elderly female presenting today with worsening hypoxia, confusion in the setting of recent community-acquired pneumonia completing antibiotics 4 days ago.  Patient is febrile here to 100.3 rectally in the setting of tachycardia and hypoxia concern for sepsis.  Code sepsis was initiated patient  will be started on IV fluids.  She does not have history of heart failure and had a normal BNP this week.  Also concern for possible PE.  Patient did have persistent tachycardia upon arrival to the facility and was waiting for a D-dimer and Doppler ultrasound.  She was started on Eliquis 2 days ago.  She has some mild epigastric pain but no other peritoneal signs of her abdomen with lower suspicion is that being the cause of her symptoms.  She had a UA done at the facility today which showed no significant signs for infection.  Patient was treated for respiratory infection now hospital-acquired due to failure of azithromycin and cefuroxime.  Labs, imaging and EKG pending.  6:32 PM Patient's labs show CMP with mild hyponatremia of 146 and creatinine of 1.4.  Leukocytosis of 16,000 with stable hemoglobin of 11.  Lactic acid normal at 1.4 and urine today with moderate leukocytes greater than 50 white blood cells and many bacteria with clumps of white blood cells and concern for possible UTI.  Chest x-ray showed hypoinflation but no localizing symptoms.  After 1 L of IV fluid patient's heart rate improved to the low 100s.  Oxygen saturation remained good and will attempt to de-escalate to nasal cannula.  Will admit for sepsis and further care.  MDM Number of Diagnoses or Management Options   Amount and/or Complexity of Data Reviewed Clinical lab tests: ordered and reviewed Tests in the radiology section of CPT: ordered and reviewed Tests in the medicine section of CPT: ordered and reviewed Decide to obtain previous medical records or to obtain history from someone other than the patient: yes Obtain history from someone other than the patient: yes Review and summarize past medical records: yes Discuss the patient with other providers: yes Independent visualization of images, tracings, or specimens: yes  Risk of Complications, Morbidity, and/or Mortality Presenting problems: high Diagnostic  procedures: low Management options: low  Patient Progress Patient progress: stable  Final Clinical Impression(s) / ED Diagnoses Final diagnoses:  Sepsis without acute organ dysfunction, due to unspecified organism [facility])  Urinary tract infection with hematuria, site unspecified    Rx / DC Orders ED Discharge Orders    None       Gwyneth Sprout, MD 09/24/2019 1849

## 2019-09-16 NOTE — ED Triage Notes (Signed)
The pt arrived by [facility] from [facility] in  [place]  They were called out for low 02 sats  Poss uti ?? Why it is suspected the pt has a recent  Dx of pneumonia  Pt has dementia  Responds to pain does not  Answers questions asked

## 2019-09-17 DIAGNOSIS — N1831 Chronic kidney disease, stage 3a: Secondary | ICD-10-CM | POA: Diagnosis present

## 2019-09-17 DIAGNOSIS — N390 Urinary tract infection, site not specified: Secondary | ICD-10-CM | POA: Diagnosis present

## 2019-09-17 LAB — COMPREHENSIVE METABOLIC PANEL
ALT: 12 U/L (ref 0–44)
AST: 21 U/L (ref 15–41)
Albumin: 2.3 g/dL — ABNORMAL LOW (ref 3.5–5.0)
Alkaline Phosphatase: 38 U/L (ref 38–126)
Anion gap: 12 (ref 5–15)
BUN: 21 mg/dL (ref 8–23)
CO2: 23 mmol/L (ref 22–32)
Calcium: 8.9 mg/dL (ref 8.9–10.3)
Chloride: 107 mmol/L (ref 98–111)
Creatinine, Ser: 1.16 mg/dL — ABNORMAL HIGH (ref 0.44–1.00)
GFR calc Af Amer: 51 mL/min — ABNORMAL LOW (ref >60)
GFR calc non Af Amer: 44 mL/min — ABNORMAL LOW (ref >60)
Glucose, Bld: 86 mg/dL (ref 70–99)
Potassium: 3.7 mmol/L (ref 3.5–5.1)
Sodium: 142 mmol/L (ref 135–145)
Total Bilirubin: 1 mg/dL (ref 0.3–1.2)
Total Protein: 5.5 g/dL — ABNORMAL LOW (ref 6.5–8.1)

## 2019-09-17 LAB — CBC WITH DIFFERENTIAL/PLATELET
Abs Immature Granulocytes: 0.1 10*3/uL — ABNORMAL HIGH (ref 0.00–0.07)
Basophils Absolute: 0.1 10*3/uL (ref 0.0–0.1)
Basophils Relative: 0 %
Eosinophils Absolute: 0.2 10*3/uL (ref 0.0–0.5)
Eosinophils Relative: 2 %
HCT: 36 % (ref 36.0–46.0)
Hemoglobin: 10.7 g/dL — ABNORMAL LOW (ref 12.0–15.0)
Immature Granulocytes: 1 %
Lymphocytes Relative: 17 %
Lymphs Abs: 2.1 10*3/uL (ref 0.7–4.0)
MCH: 27.3 pg (ref 26.0–34.0)
MCHC: 29.7 g/dL — ABNORMAL LOW (ref 30.0–36.0)
MCV: 91.8 fL (ref 80.0–100.0)
Monocytes Absolute: 1.1 10*3/uL — ABNORMAL HIGH (ref 0.1–1.0)
Monocytes Relative: 8 %
Neutro Abs: 9.2 10*3/uL — ABNORMAL HIGH (ref 1.7–7.7)
Neutrophils Relative %: 72 %
Platelets: 345 10*3/uL (ref 150–400)
RBC: 3.92 MIL/uL (ref 3.87–5.11)
RDW: 16.3 % — ABNORMAL HIGH (ref 11.5–15.5)
WBC: 12.8 10*3/uL — ABNORMAL HIGH (ref 4.0–10.5)
nRBC: 0 % (ref 0.0–0.2)

## 2019-09-17 LAB — MRSA PCR SCREENING: MRSA by PCR: NEGATIVE

## 2019-09-17 NOTE — Progress Notes (Signed)
Initial Nutrition Assessment  DOCUMENTATION CODES:   Not applicable  INTERVENTION:   Ensure Enlive po BID, each supplement provides 350 kcal and 20 grams of   Recommend liberalizing diet to REGULAR  NUTRITION DIAGNOSIS:   Inadequate oral intake related to decreased appetite as evidenced by per patient/family report.  GOAL:   Patient will meet greater than or equal to 90% of their needs  MONITOR:   PO intake, Supplement acceptance, Labs, Weight trends  REASON FOR ASSESSMENT:   Consult Assessment of nutrition requirement/status  ASSESSMENT:   79 yo female admitted with sepsis secondary to UTI, acute on chronic renal failure. PMH includes CKD III, HTN, CAD, recurrent UTI   RD working remotely.  Noted pt somnolent, difficult to arouse per MD this AM. Attempted to reach pt by phone but unsuccessful  No documented po intake  Current weight 70 kg; admit weight 77.8 and 83.8 kg. Unsure of actual weight at present. No previous weight encounters. No height.   Labs: reviewed Meds: LR at 100 ml/hr, MVI with Minerals  NUTRITION - FOCUSED PHYSICAL EXAM:  Deferred  Diet Order:   Diet Order            Diet Heart Room service appropriate? Yes; Fluid consistency: Thin  Diet effective now                 EDUCATION NEEDS:   Not appropriate for education at this time  Skin:  Skin Assessment: Reviewed RN Assessment  Last BM:  no documented BM  Height:   Ht Readings from Last 1 Encounters:  No data found for Ht    Weight:   Wt Readings from Last 1 Encounters:  09/17/19 70 kg    BMI:  There is no height or weight on file to calculate BMI.  Estimated Nutritional Needs:   Kcal:  1550-1700 kcals  Protein:  70-80 g  Fluid:  >/= 1.5 L   Romelle Starcher MS, RDN, LDN, CNSC Registered Dietitian III Clinical Nutrition RD Pager and On-Call Pager Number Located in [place]

## 2019-09-17 NOTE — Progress Notes (Signed)
   Subjective: Asleep when entering room, difficult to arouse. Reports some abdominal pain. Denies any other complaints.   Objective:  Vital signs in last 24 hours: Vitals:   09/17/19 0026 09/17/19 0556 09/17/19 0601 09/17/19 0613  BP: 105/82 (!) 190/161 (!) 145/85   Pulse: (!) 104 99  100  Resp: (!) 23 (!) 21  (!) 21  Temp: 98.4 F (36.9 C) 98.6 F (37 C)    TempSrc: Oral Oral    SpO2: 100% 100% 98% 98%  Weight: 70 kg      General: Elderly female, laying in bed, sleeping Cardiac: RRR, no m/r/g Pulmonary: RLL field crackles, normal work of breathing, no wheezing Abdomen: Soft, TTP over epigastric area, normoactive BS  Assessment/Plan:  Active Problems:   Sepsis (HCC)   Acute on chronic renal failure (HCC)  This is 79 year old female with a history of CKD stage 3, HTN, CAD, and recurrent UTIs, with recent hospitalization for CAP and acute hypoxic respiratory failure who was noted to have increased COB and hypoxia at her SNF, and noted to have findings concerning for a UTI while here.   Sepsis likely 2/2 UTI: History of recurrent UTIs and recent history of pneumonia. Noted to have low grade fevers, tachycardia, leukocytosis, and many bacteria on U/A. She was noted to be hypoxic and was on oxygen however she no longer is requiring this. CXR was also unremarkable so recurrent pneumonia appears less likely at this time. Patient very tired on exam today, unable to answer many questions. Does have suprapubic tenderness on exam. Urine culture is still pending. Currently on vanc and cefepime. WBC improved to 12.8 from 16.   -Daily CBC -Continue vanc and cefepime, consider narrowing pending urine culture -F/u urine culture -F/u blood cultures  Hypoxia: There was concern for PE at her facility and she was started on empiric eliquis, D-dimer was normal. Currently on RA. PE appears less likely at this time. Does appear to have some RLL field crackles on exam. Could be from her recent  pneumonia.   -Stopped eliquis -Monitor need for supplemental O2  Acute on chronic renal failure: -Cr up to 1.4 from baseline of around 0.7. Concern for pre-renal azotemia given decreased PO intake. Given fluids overnight. Cr 1.16 after fluids.  -BMP in AM  Hyperbilirubinemia TBili 1.5. possibly 2/2 dehydration. T Bili down to 1 today. No further work up.    HTN -Continue home clonidine  Prior to Admission Living Arrangement: [facility] Anticipated Discharge Location: TBD, likely return to [facility] Barriers to Discharge: Continued medical management Dispo: Anticipated discharge in approximately 1-3 day(s).   Claudean Severance, MD 09/17/2019, 6:27 AM Pager: [phone] After 5pm on weekdays and 1pm on weekends: On Call pager [phone]

## 2019-09-17 NOTE — ED Notes (Signed)
Report given to rn on 5n 

## 2019-09-17 NOTE — Progress Notes (Signed)
Pt arrived in the unit. MEWS score in the ED has been red and yellow. Pt alert and oriented x1.

## 2019-09-17 NOTE — Plan of Care (Signed)
  Problem: Safety: Goal: Ability to remain free from injury will improve Outcome: Progressing   Problem: Skin Integrity: Goal: Risk for impaired skin integrity will decrease Outcome: Progressing   

## 2019-09-17 NOTE — Plan of Care (Addendum)
Hoffman notified, 1 run SVT HR 166 @1955 , and again HR 160 @2000 . HR not sustained. Pt recently changed d/t bowel movement. Pt also repositioned to prevent bed sore and complains d/t not being on her normal side. Pt states it's a little uncomfortable but she's okay. Pt currently 104 HR. Hoffman made no new orders, just continue to monitor.   Assessed patient's sacral area when changing and cleaning up patient. Skin is extremely thin above sacral. Place a foam pad to prevent further breakdown. Area is blanchable, but has a red tent to it. Will continue to turn patient every 2 hours.  Telemetry order expired, paged , states to continue with telemetry for now.  Problem: Education: Goal: Knowledge of General Education information will improve Description: Including pain rating scale, medication(s)/side effects and non-pharmacologic comfort measures Outcome: Progressing   Problem: Activity: Goal: Risk for activity intolerance will decrease Outcome: Progressing   Problem: Pain Managment: Goal: General experience of comfort will improve Outcome: Progressing   Problem: Safety: Goal: Ability to remain free from injury will improve Outcome: Progressing   Problem: Skin Integrity: Goal: Risk for impaired skin integrity will decrease Outcome: Progressing

## 2019-09-18 ENCOUNTER — Other Ambulatory Visit: Payer: Self-pay

## 2019-09-18 ENCOUNTER — Encounter (HOSPITAL_COMMUNITY): Payer: Self-pay | Admitting: Internal Medicine

## 2019-09-18 LAB — BASIC METABOLIC PANEL
Anion gap: 12 (ref 5–15)
BUN: 16 mg/dL (ref 8–23)
CO2: 22 mmol/L (ref 22–32)
Calcium: 8.9 mg/dL (ref 8.9–10.3)
Chloride: 105 mmol/L (ref 98–111)
Creatinine, Ser: 1.18 mg/dL — ABNORMAL HIGH (ref 0.44–1.00)
GFR calc Af Amer: 50 mL/min — ABNORMAL LOW (ref >60)
GFR calc non Af Amer: 44 mL/min — ABNORMAL LOW (ref >60)
Glucose, Bld: 96 mg/dL (ref 70–99)
Potassium: 3.3 mmol/L — ABNORMAL LOW (ref 3.5–5.1)
Sodium: 139 mmol/L (ref 135–145)

## 2019-09-18 LAB — CBC
HCT: 35.1 % — ABNORMAL LOW (ref 36.0–46.0)
Hemoglobin: 10.7 g/dL — ABNORMAL LOW (ref 12.0–15.0)
MCH: 27.1 pg (ref 26.0–34.0)
MCHC: 30.5 g/dL (ref 30.0–36.0)
MCV: 88.9 fL (ref 80.0–100.0)
Platelets: 362 10*3/uL (ref 150–400)
RBC: 3.95 MIL/uL (ref 3.87–5.11)
RDW: 16 % — ABNORMAL HIGH (ref 11.5–15.5)
WBC: 15.5 10*3/uL — ABNORMAL HIGH (ref 4.0–10.5)
nRBC: 0 % (ref 0.0–0.2)

## 2019-09-18 MED ORDER — LORAZEPAM 0.5 MG PO TABS: 0.2500 mg | ORAL_TABLET | Freq: Once | ORAL | Status: DC

## 2019-09-18 MED ORDER — PIPERACILLIN-TAZOBACTAM 3.375 G IVPB 30 MIN: 3.3750 g | Freq: Three times a day (TID) | INTRAVENOUS | Status: DC

## 2019-09-18 MED ORDER — POTASSIUM CHLORIDE CRYS ER 20 MEQ PO TBCR
40.0000 meq | EXTENDED_RELEASE_TABLET | Freq: Two times a day (BID) | ORAL | Status: AC
  Administered 2019-09-18 – 2019-09-19 (×2): 40 meq via ORAL
  Filled 2019-09-18 (×2): qty 2

## 2019-09-18 MED ORDER — PIPERACILLIN-TAZOBACTAM 3.375 G IVPB
3.3750 g | Freq: Three times a day (TID) | INTRAVENOUS | Status: DC
  Administered 2019-09-18 – 2019-09-22 (×12): 3.375 g via INTRAVENOUS
  Filled 2019-09-18 (×12): qty 50

## 2019-09-18 NOTE — Progress Notes (Addendum)
   09/18/19 1915  Assess: MEWS Score  Temp 98.1 F (36.7 C)  BP (!) 159/123  Pulse Rate (!) 124  Resp 17  Level of Consciousness Alert  SpO2 96 %  O2 Device Room Air  Assess: MEWS Score  MEWS Temp 0  MEWS Systolic 0  MEWS Pulse 2  MEWS RR 0  MEWS LOC 0  MEWS Score 2  MEWS Score Color Yellow  Assess: if the MEWS score is Yellow or Red  Were vital signs taken at a resting state? Yes  Focused Assessment Documented focused assessment  Early Detection of Sepsis Score *See Row Information* Low  MEWS guidelines implemented *See Row Information* Yes  Treat  MEWS Interventions Other (Comment) (contacted dr. Laural Benes waiting on orders)  Take Vital Signs  Increase Vital Sign Frequency  Yellow: Q 2hr X 2 then Q 4hr X 2, if remains yellow, continue Q 4hrs  Escalate  MEWS: Escalate Yellow: discuss with charge nurse/RN and consider discussing with provider and RRT  Notify: Charge Nurse/RN  Name of Charge Nurse/RN Notified Electrical engineer  Date Charge Nurse/RN Notified 09/18/19  Time Charge Nurse/RN Notified 1925  Notify: Provider  Provider Name/Title Laural Benes  Date Provider Notified 09/18/19  Time Provider Notified 2015  Notification Type Page  Notification Reason Change in status (pt delium increasing and HR increasing)  Response No new orders (continue to monitor and notify if pt continues to decline)  Date of Provider Response 09/18/19  Time of Provider Response 2020  Notify: Rapid Response  Name of Rapid Response RN Notified  (NA)  Date Rapid Response Notified  (NA)  Time Rapid Response Notified  (NA)  Document  Patient Outcome Other (Comment) (continue to monitor, pt abx changed, notify if pt declines)  Progress note created (see row info) Yes    Notified Dr. Laural Benes, told Dr. Rock Nephew has changed since I had her last night. Last night pt was more alert and would engage in conversation even though she may not have been answering questions correctly, but she was still able to form  words clearly. Pt tonight is not able to form clear words. Pt is very sleepy and does not want to communicate, pt is also not wanting to follow commands as she did the night before. Spoke with Laural Benes and he stated today they have change abx and hopefully that will help with infection, so for now I will continue to monitor and if I feel as if the patient is continuing to decline I will notify him asap. Currently continuing to monitor pt and follow MEWS protocol.

## 2019-09-18 NOTE — Progress Notes (Addendum)
Subjective: Examined patient at bedside.  Patient's speech is grossly incoherent. Is able to shake her head from left to right when I asked if she was in any pain.  Spoke with patient's daughter, who is concerned that the patient's health is not improving. She asked if her failure to improve could be due to a stroke. Discussed with her that there is no clinical indication for a stroke workup to be done at this time as patient's presentation along with lab findings and urinalysis/urine culture are suggestive of an infectious etiology, which she is being treated for with IV antibiotics. Daughter confirmed understanding. Discussed with daughter that we will continue to monitor patient's response to treatment and will reassess in the morning.  All questions and concerns were addressed.  Objective:  Vital signs in last 24 hours: Vitals:   09/17/19 1919 09/17/19 2202 09/18/19 0427 09/18/19 0752  BP: (!) 174/83 (!) 162/90 (!) 166/78 (!) 142/78  Pulse: (!) 109  (!) 105 99  Resp: 18  16 16   Temp: 99 F (37.2 C)  98.7 F (37.1 C) 98.6 F (37 C)  TempSrc: Oral  Oral   SpO2: 94%  96% 96%  Weight:       Physical Exam: General: Elderly female, lying in bed, NAD. CV: Regular rate and normal rhythm. No m/r/g. Pulmonary: Limited due to poor respiratory effort. Decreased breath sounds at the base, no overtly adventitious sounds noted. GI: Soft, non-distended abdomen. Slight grimacing noted when palpating epigastric and suprapubic regions. Normoactive bowel sounds in all quadrants. No guarding or rebound tenderness noted. Neuro: Unable to assess as patient unable to follow commands.  Assessment/Plan:  Active Problems:   Sepsis (HCC)   Acute on chronic renal failure (HCC)   Urinary tract infection   Chronic kidney disease, stage 3a  Patient is an 79 year old female with history of CKD stage 3, CAD, HTN, and recurrent UTI's (on keflex), with recent hospitalization for CAP and acute hypoxic  respiratory failure who presented with increased SOB and hypoxia from her SNF, and found to have a UTI.  Sepsis 2/2 UTI from Citrobacter freundii and E facecalis History of recurrent UTIs and recent history of CAP. Noted to have low grade fevers, tachycardia, leukocytosis, and many bacteria on UA. She was noted to be hypoxic and was on oxygen however she no longer is requiring this. CXR was unremarkable so recurrent pneumonia appears less likely at this time. She has suprapubic tenderness on exam. Urine culture positive for Citrobacter freundii and E faecalis, resistant to cefazolin and ceftriaxone. WBCs increased from 12.8 to 15.5. Initially on vanc and cefepime, but will switch cefepime to zosyn.    -Daily CBC -Spoke with pharmacy, recommended switching to vanc and zosyn as zosyn has better coverage for citrobacter. -Discontinued cefepime -Started zosyn 3.375g q8h -Monitor renal function since patient on vanc and zosyn -Blood cultures showing no growth in 2 days  Hypoxia: Initially there was a concern for PE at patient's nursing facility and she was started on empiric Eliquis. D-dimer was normal. Currently on RA. PE appears less likely at this time. Lung exam today showed no overt abnormalities, however it was limited due to poor respiratory effort.   -Stopped eliquis -Monitor need for supplemental O2   Acute on chronic renal failure: Cr up to 1.4 from baseline of around 0.7. Concern for pre-renal azotemia given decreased PO intake. Given fluids overnight. Cr 1.16 after fluids on 09/17/19. Cr 1.18 on 09/18/19. -BMP in AM -Continue to monitor renal  function as patient now on vanc and zosyn  Hypokalemia K went from 3.7-->3.3 on 09/18/19. Will replete with BID.   Hyperbilirubinemia TBili 1.5. possibly 2/2 dehydration. T Bili down to 1 on 09/17/19. No further work up necessary.    HTN -Continue home clonidine   Prior to Admission Living Arrangement: [facility] Anticipated Discharge  Location: TBD, likely return to [facility] Barriers to Discharge: Continued medical management Dispo: Anticipated discharge in approximately 2-3 day(s).   Merrilyn Puma, MD 09/18/2019, 10:52 AM Pager: [phone] After 5pm on weekdays and 1pm on weekends: On Call pager [phone]

## 2019-09-18 NOTE — Plan of Care (Deleted)
  Problem: Education: Goal: Knowledge of General Education information will improve Description: Including pain rating scale, medication(s)/side effects and non-pharmacologic comfort measures 09/17/2019 2011 by Darleene Cleaver, RN Outcome: Progressing   Problem: Activity: Goal: Risk for activity intolerance will decrease 09/17/2019 2011 by Darleene Cleaver, RN Outcome: Progressing   Problem: Pain Managment: Goal: General experience of comfort will improve 09/17/2019 2011 by Darleene Cleaver, RN Outcome: Progressing   Problem: Safety: Goal: Ability to remain free from injury will improve 09/17/2019 2011 by Darleene Cleaver, RN Outcome: Progressing   Problem: Skin Integrity: Goal: Risk for impaired skin integrity will decrease 09/18/2019 0138 by Darleene Cleaver, RN Outcome: Progressing

## 2019-09-19 ENCOUNTER — Inpatient Hospital Stay (HOSPITAL_COMMUNITY): Payer: PPO

## 2019-09-19 DIAGNOSIS — N390 Urinary tract infection, site not specified: Secondary | ICD-10-CM

## 2019-09-19 DIAGNOSIS — I129 Hypertensive chronic kidney disease with stage 1 through stage 4 chronic kidney disease, or unspecified chronic kidney disease: Secondary | ICD-10-CM

## 2019-09-19 DIAGNOSIS — N1831 Chronic kidney disease, stage 3a: Secondary | ICD-10-CM

## 2019-09-19 DIAGNOSIS — A419 Sepsis, unspecified organism: Secondary | ICD-10-CM | POA: Diagnosis not present

## 2019-09-19 DIAGNOSIS — N179 Acute kidney failure, unspecified: Secondary | ICD-10-CM

## 2019-09-19 DIAGNOSIS — E876 Hypokalemia: Secondary | ICD-10-CM

## 2019-09-19 DIAGNOSIS — G9340 Encephalopathy, unspecified: Secondary | ICD-10-CM

## 2019-09-19 LAB — BASIC METABOLIC PANEL
Anion gap: 12 (ref 5–15)
BUN: 14 mg/dL (ref 8–23)
CO2: 22 mmol/L (ref 22–32)
Calcium: 9 mg/dL (ref 8.9–10.3)
Chloride: 108 mmol/L (ref 98–111)
Creatinine, Ser: 1.14 mg/dL — ABNORMAL HIGH (ref 0.44–1.00)
GFR calc Af Amer: 53 mL/min — ABNORMAL LOW (ref >60)
GFR calc non Af Amer: 45 mL/min — ABNORMAL LOW (ref >60)
Glucose, Bld: 100 mg/dL — ABNORMAL HIGH (ref 70–99)
Potassium: 3.7 mmol/L (ref 3.5–5.1)
Sodium: 142 mmol/L (ref 135–145)

## 2019-09-19 LAB — CBC
HCT: 35.5 % — ABNORMAL LOW (ref 36.0–46.0)
Hemoglobin: 11.1 g/dL — ABNORMAL LOW (ref 12.0–15.0)
MCH: 27.4 pg (ref 26.0–34.0)
MCHC: 31.3 g/dL (ref 30.0–36.0)
MCV: 87.7 fL (ref 80.0–100.0)
Platelets: 352 10*3/uL (ref 150–400)
RBC: 4.05 MIL/uL (ref 3.87–5.11)
RDW: 15.9 % — ABNORMAL HIGH (ref 11.5–15.5)
WBC: 15.8 10*3/uL — ABNORMAL HIGH (ref 4.0–10.5)
nRBC: 0 % (ref 0.0–0.2)

## 2019-09-19 LAB — URINE CULTURE: Culture: >=100000 — AB

## 2019-09-19 MED ORDER — LORAZEPAM 2 MG/ML IJ SOLN
0.5000 mg | Freq: Three times a day (TID) | INTRAMUSCULAR | Status: DC
  Administered 2019-09-19: 0.5 mg via INTRAVENOUS
  Filled 2019-09-19: qty 1

## 2019-09-19 MED ORDER — LORAZEPAM 2 MG/ML IJ SOLN
0.5000 mg | Freq: Once | INTRAMUSCULAR | Status: AC
  Administered 2019-09-19: 0.5 mg via INTRAVENOUS
  Filled 2019-09-19: qty 1

## 2019-09-19 NOTE — Progress Notes (Signed)
Notified MD has to attempt lumbar puncture before radiology will do it.

## 2019-09-19 NOTE — Progress Notes (Signed)
Subjective: Examined patient at bedside.  Nonverbal and agitated on exam today. Does not open eyes to voice, does respond to noxious stimuli. Appears worse than yesterday.   Performed STAT CT without contrast, negative for acute CVA.  Spoke to daughter, [name] (cell phone [phone]), for consent to perform LP in order to rule meningitis/encephalitis as cause for clinical worsening. Daughter gives consent for the procedure as patient is currently altered. Daughter states that patient's spine is fused in the lumbar region.  Objective:  Vital signs in last 24 hours: Vitals:   09/19/19 0115 09/19/19 0515 09/19/19 0920 09/19/19 1322  BP: (!) 144/114 (!) 158/88 (!) 142/101 (!) 159/94  Pulse: (!) 112 (!) 109 (!) 106 (!) 105  Resp: (!) 24 (!) 22 20 17   Temp: 98.1 F (36.7 C) 97.8 F (36.6 C) 97.7 F (36.5 C) 97.7 F (36.5 C)  TempSrc: Axillary Axillary Axillary Oral  SpO2: 99% 98% 90% 92%  Weight:      Height:       Physical Exam: General: Elderly female, ill-appearing, lying in bed, agitated and uncomfortable. Eyes: Dilated pupils, unresponsive to light. CV: Regular rate and normal rhythm. No m/r/g. Pulm: Very limited due to poor respiratory effort. Unable to accurately assess, but no overtly adventitious sounds noted. Neuro: Unable to fully assess as patient is greatly altered and unable to follow commands. Not alert and oriented. Does not respond to voice, but does respond to pain.   Assessment/Plan:  Active Problems:   Sepsis (HCC)   Acute on chronic renal failure (HCC)   Urinary tract infection   Chronic kidney disease, stage 3a  Patient is an 79 year old female with history of CKD stage 3, CAD, HTN, and recurrent UTI's (on keflex), with recent hospitalization for CAP and acute hypoxic respiratory failure who presented with increased SOB and hypoxia from her SNF, and found to have sepsis likely 2/2 acute complicated cystitis.  Sepsis 2/2 UTI from Citrobacter freundii  and E facecalis: Septic Encephalopathy: History of recurrent UTIs and recent history of CAP. Noted to have low grade fevers, tachycardia, leukocytosis, and many bacteria on UA. She was noted to be hypoxic and was on oxygen however she no longer is requiring this. CXR was unremarkable so recurrent pneumonia appears less likely at this time. She had suprapubic tenderness on exam. Urine culture positive for Citrobacter freundii and E faecalis, resistant to cefazolin and ceftriaxone. WBCs increased from 15.5 to 15.8. Initially on vanc and cefepime, but switched cefepime to zosyn for better coverage for citrobacter. -On 09/19/19, patient appears more altered. WBCs increased from 15.5 to 15.8. Continues to be tachycardic and tachypneic. She appears agitated on exam and pupils are dilated and unresponsive to light. Initially a concern for benzodiazepine withdrawal due to history of xanax use, as per daughter. Given 0.5mg  IV ativan dose, which calmed the patient, but did not lead to improved mental status. STAT CT head w/o contrast ordered, showed no acute intracranial abnormality. Patient's daughter, 09/21/19, consented for LP so orders were placed. Consulted IR and informed them about L1-L5 spinal fusion, IR stated that it should not be an issue. Patient is already on vanc/zosyn which should cover majority of bacterial meningitis, but the possibility remains for viral etiology for meningitis/encephalitis and thus added testing for HSV.  -Daily CBC -Spoke with pharmacy, recommended switching to vanc and zosyn as zosyn has better coverage for citrobacter. -Discontinued cefepime -Started zosyn 3.375g q8h -Continue IV fluids with LR -Monitor renal function since patient on vanc and  zosyn -Blood cultures showing no growth in 3 days -CT head w/o contrast showed no acute intracranial abnormality -f/u lumbar puncture results -if LP negative, will consider EEG to rule out possible seizure -on delirium  precautions   Hypoxia: Initially there was a concern for PE at patient's nursing facility and she was started on empiric Eliquis. D-dimer was normal. Currently on RA. PE appears less likely at this time. Lung exam today showed no overt abnormalities, however it was limited due to poor respiratory effort. -Stopped eliquis -Monitor need for supplemental O2  Acute on chronic renal failure: Cr up to 1.4 from baseline of around 0.7. Concern for pre-renal azotemia given decreased PO intake. Cr improved after fluid administration. -Cr 1.16 --> 1.18 --> 1.14 (stable) -BMP in AM -Continue to monitor renal function as patient now on vanc and zosyn  Hypokalemia K went from 3.7-->3.3 on 09/18/19. Repleted with BID. -K 3.7 on 09/19/19  Hyperbilirubinemia TBili 1.5.possibly 2/2 dehydration. T Bili down to 1 on 09/17/19. No further work up necessary.  HTN -Continue home clonidine   Prior to Admission Living Arrangement: [facility] Anticipated Discharge Location: TBD, likely return to [facility] Barriers to Discharge: Continued medical management Dispo: Anticipated discharge in approximately 2-3 day(s).   Merrilyn Puma, MD 09/19/2019, 4:19 PM Pager: [phone] After 5pm on weekdays and 1pm on weekends: On Call pager [phone]

## 2019-09-19 NOTE — Plan of Care (Signed)

## 2019-09-19 NOTE — Plan of Care (Addendum)
See note for Yellow MEWS @1915  See note for Red MEWS @2115  Dr. notified and aware of both.  Pt is currently resting well. Pt has small tremors that were not present night before last. Pt still incomprehensible while speaking. Pt does not follow commands. When taking BP pt is tensing up d/t not liking the tightness of the cuff, this behavior was not present night before last. EKG was obtained as ordered and hardcopy placed in pt chart in the drawer outside room. Ativan IV given as ordered, pt tremors still remain present, but pt was able to rest some. Will continue to monitor patient.    Problem: Education: Goal: Knowledge of General Education information will improve Description: Including pain rating scale, medication(s)/side effects and non-pharmacologic comfort measures Outcome: Progressing   Problem: Activity: Goal: Risk for activity intolerance will decrease Outcome: Progressing   Problem: Pain Managment: Goal: General experience of comfort will improve Outcome: Progressing   Problem: Safety: Goal: Ability to remain free from injury will improve Outcome: Progressing   Problem: Skin Integrity: Goal: Risk for impaired skin integrity will decrease Outcome: Progressing

## 2019-09-19 NOTE — Progress Notes (Addendum)
   09/18/19 2115  Assess: MEWS Score  Temp 97.8 F (36.6 C)  BP (!) 164/143  Pulse Rate (!) 120  Resp (!) 27  Level of Consciousness Alert  SpO2 96 %  O2 Device Room Air  Assess: MEWS Score  MEWS Temp 0  MEWS Systolic 0  MEWS Pulse 2  MEWS RR 2  MEWS LOC 0  MEWS Score 4  MEWS Score Color Red  Assess: if the MEWS score is Yellow or Red  Were vital signs taken at a resting state? Yes  Focused Assessment Documented focused assessment  Early Detection of Sepsis Score *See Row Information* High  MEWS guidelines implemented *See Row Information* Yes  Treat  MEWS Interventions Other (Comment) (waiting for orders)  Take Vital Signs  Increase Vital Sign Frequency  Red: Q 1hr X 4 then Q 4hr X 4, if remains red, continue Q 4hrs  Escalate  MEWS: Escalate Red: discuss with charge nurse/RN and provider, consider discussing with RRT  Notify: Charge Nurse/RN  Name of Charge Nurse/RN Notified Yrneh RN  Date Charge Nurse/RN Notified 09/18/19  Time Charge Nurse/RN Notified 2145  Notify: Provider  Provider Name/Title Laural Benes  Date Provider Notified 09/18/19  Time Provider Notified 2150  Notification Type Page  Notification Reason Other (Comment) (status remains the same)  Response See new orders (EKG ordered)  Date of Provider Response 09/19/19  Time of Provider Response 2200  Notify: Rapid Response  Name of Rapid Response RN Notified Courtney  Date Rapid Response Notified 09/19/19  Time Rapid Response Notified 2140  Document  Patient Outcome Other (Comment) (EKG ordered, continue to monitor, Dr assessed)  Progress note created (see row info) Yes   Dr. Laural Benes aware of current situation. Rapid response rounded and stated to do a bladder scan, no urine present on scan pt recently had an output of 600 along with 1 large occurrence earlier in the shift. EKG order. Ativan IV ordered. Will continue to monitor for any change in status.

## 2019-09-20 ENCOUNTER — Inpatient Hospital Stay (HOSPITAL_COMMUNITY): Payer: PPO

## 2019-09-20 DIAGNOSIS — F13931 Sedative, hypnotic or anxiolytic use, unspecified with withdrawal delirium: Secondary | ICD-10-CM

## 2019-09-20 DIAGNOSIS — A419 Sepsis, unspecified organism: Secondary | ICD-10-CM | POA: Diagnosis not present

## 2019-09-20 DIAGNOSIS — G9340 Encephalopathy, unspecified: Secondary | ICD-10-CM | POA: Diagnosis not present

## 2019-09-20 DIAGNOSIS — R569 Unspecified convulsions: Secondary | ICD-10-CM | POA: Diagnosis not present

## 2019-09-20 DIAGNOSIS — N179 Acute kidney failure, unspecified: Secondary | ICD-10-CM | POA: Diagnosis not present

## 2019-09-20 DIAGNOSIS — N390 Urinary tract infection, site not specified: Secondary | ICD-10-CM | POA: Diagnosis not present

## 2019-09-20 LAB — CBC
HCT: 35.9 % — ABNORMAL LOW (ref 36.0–46.0)
Hemoglobin: 11.1 g/dL — ABNORMAL LOW (ref 12.0–15.0)
MCH: 27.3 pg (ref 26.0–34.0)
MCHC: 30.9 g/dL (ref 30.0–36.0)
MCV: 88.4 fL (ref 80.0–100.0)
Platelets: 337 10*3/uL (ref 150–400)
RBC: 4.06 MIL/uL (ref 3.87–5.11)
RDW: 16.2 % — ABNORMAL HIGH (ref 11.5–15.5)
WBC: 15.1 10*3/uL — ABNORMAL HIGH (ref 4.0–10.5)
nRBC: 0 % (ref 0.0–0.2)

## 2019-09-20 LAB — VANCOMYCIN, TROUGH: Vancomycin Tr: 38 ug/mL (ref 15–20)

## 2019-09-20 MED ORDER — LORAZEPAM 2 MG/ML IJ SOLN
0.5000 mg | Freq: Four times a day (QID) | INTRAMUSCULAR | Status: DC
  Administered 2019-09-20 – 2019-09-21 (×7): 0.5 mg via INTRAVENOUS
  Filled 2019-09-20 (×7): qty 1

## 2019-09-20 NOTE — Progress Notes (Signed)
Pharmacy Antibiotic Note  Judy Mooney is a 79 y.o. female admitted on October 12, 2019 with pneumonia. Patient now with metabolic encephalopathy and sepsis 2/2 polymicrobial UTI. Pharmacy has been consulted for Vancomycin dosing. Patient is afebrile but with persistent leukocytosis at 15.1 today.  - VT 38   Height: 5' (152.4 cm) Weight: 76.6 kg (168 lb 14 oz) IBW/kg (Calculated) : 45.5  Temp (24hrs), Avg:98.1 F (36.7 C), Min:97.7 F (36.5 C), Max:98.3 F (36.8 C)  Recent Labs  Lab 10/12/19 1705 Oct 12, 2019 1725 09/17/19 0749 09/18/19 0226 09/19/19 0950 09/20/19 0520  WBC  --  16.3* 12.8* 15.5* 15.8* 15.1*  CREATININE  --  1.41* 1.16* 1.18* 1.14*  --   LATICACIDVEN 1.4  --   --   --   --   --   VANCOTROUGH  --   --   --   --   --  38*    Estimated Creatinine Clearance: 36 mL/min (A) (by C-G formula based on SCr of 1.14 mg/dL (H)).    Allergies  Allergen Reactions  . Sulfa Antibiotics   . Tetracyclines & Related   . Trimethoprim     Antimicrobials this admission: 7/16 Cefepime >> 7/18 7/16 Vancomycin >>  7/18 Zosyn >>  Microbiology results: 7/16 Urine: Citrobacter (R to cephalosporins, S to Zosyn), Enterococcus faecalis (S to vanc, amp) 7/16 Blood: No growth x 4 days 7/17 MRSA PCR neg  Plan:  - Hold vancomycin due to elevated trough. Draw vanc random tomorrow with morning labs to determine when to redose (redose when <20). - Monitor renal function and urine output, clinical progress, abx plan.   Thank you for allowing pharmacy to be a part of this patient's care.  Pervis Hocking, PharmD PGY1 Pharmacy Resident 09/20/2019 9:49 AM  Please check [email or web address] for unit-specific pharmacy phone numbers.

## 2019-09-20 NOTE — Progress Notes (Addendum)
Subjective: Examined patient at bedside.  Continues to be nonverbal on exam. Initially, appeared to be similar to yesterday.   However, an hour after administration of ativan, she is able to open eyes to voice. Although she still appears ill, she is able to make make noises when asked questions.    Addendum: Saw Judy Mooney at 1630. Daughter in room. Patient opens eyes to voice and is able to track me around room. Clinically appears much improved when compared to yesterday. Said "thank you" and shook head from side to side when asked if she was in pain. Jaw still shaking when she tries to talk. Daughter states that she is happy to see patient's improvement and relieved that she did not have to undergo LP. Asked about home xanax use. Daughter reports that patient started taking xanax around 2 years ago after her husband passed away, initially started with 0.5mg  but was increased to 1mg  at some point in the past.  Objective:  Vital signs in last 24 hours: Vitals:   09/19/19 1921 09/20/19 0543 09/20/19 0814 09/20/19 1326  BP: 107/77 (!) 128/116 (!) 148/88 (!) 150/81  Pulse: (!) 101 (!) 107 74 91  Resp: 18 18 17 17   Temp: 98.1 F (36.7 C) 98.3 F (36.8 C) 98.3 F (36.8 C) 97.7 F (36.5 C)  TempSrc: Axillary Oral Oral Oral  SpO2: 95% 96% (!) 89% (!) 84%  Weight:      Height:       Physical Exam: General: Elderly female, ill-appearing, lying in bed, appearing uncomfortable. Eyes: Dilated pupils, slightly responsive to light (improved from yesterday). CV: Regular rate and normal rhythm. No m/r/g Pulm: Limited due to poor respiratory effort. No overtly adventitious sounds noted. Satting well. Neuro: Unable to fully assess as patient continues to be altered and unable to follow commands. Is able to open eyes to voice and make incoherent noises to questions, which is improved from yesterday.  Assessment/Plan:  Active Problems:   Sepsis (HCC)   Acute on chronic renal failure (HCC)    Urinary tract infection   Chronic kidney disease, stage 3a   Acute encephalopathy  Patient is an 79 year old female with history of CKD stage 3, CAD, HTN, and recurrent UTI's (on keflex), with recent hospitalization for CAP and acute hypoxic respiratory failure who presented with increased SOB and hypoxia from her SNF, found to have acute encephalopathy 2/2 sepsis likely 2/2 acute complicated cystitis.   Acute Encephalopathy: Sepsis 2/2 UTIfrom Citrobacter freundii and E facecalis: Benzodiazepine Withdrawal 2/2 chronic xanax use History of recurrent UTIs and recent history of CAP. Noted to have low grade fevers, tachycardia, leukocytosis, and many bacteria on UA.She was noted to be hypoxic and was on oxygen however she no longer is requiring this. CXR was unremarkable so recurrent pneumonia appears less likely at this time.She hadsuprapubic tenderness on exam.Urine culture positive for Citrobacter freundii and E faecalis, resistant to cefazolin and ceftriaxone. WBCs increased from 15.8 to 15.1. Initiallyon vanc and cefepime, but switched cefepime to zosyn for better coverage for citrobacter. -On 09/19/19, patient appears more altered. WBCs increased from 15.5 to 15.8. Continues to be tachycardic and tachypneic. She appears agitated on exam and pupils are dilated and unresponsive to light. Concern for benzodiazepine withdrawal due to history of xanax use, as per daughter. Given 0.5mg  IV ativan dose, which calmed the patient, but did not lead to improved mental status. STAT CT head w/o contrast ordered, showed no acute intracranial abnormality. Patient's daughter, 96, consented for LP  so orders were placed, but LP held until we reassess in the morning. -On 09/20/19, patient initially appeared similar to yesterday. However, after administration of IV ativan 0.5mg , patient's status appears to have improved. Is able to open eyes to voice and make noises in response to questions. Still remains  altered compared to baseline. Due to clinical improvement with ativan, discontinued LP for now. Will continue scheduled IV ativan 0.5mg  q6h and CIWA without ativan.  -Daily CBC -Spoke with pharmacy, recommended switching to vanc and zosyn as zosyn has better coverage for citrobacter. -Discontinued cefepime -Started zosyn 3.375g q8h -Continue IV fluids -On 09/20/19, given 1L NS bolus and 1L NS infusion at 100cc/hr -Monitor renal function since patient on vanc and zosyn -Blood cultures showing no growth in 3 days -CT head w/o contrast showed no acute intracranial abnormality -Continue IV ativan 0.5mg  q6h -CIWA without ativan -EEG suggestive of moderate diffuse encephalopathy, but no seizures or epileptiform discharges seen. -Hold LP unless if patient's clinical status worsens -on delirium precautions  Acute on chronic renal failure: Cr up to 1.4 from baseline of around 0.7. Concern for pre-renal azotemia given decreased PO intake. Cr improved after fluid administration. -Cr 1.16 --> 1.18 --> 1.14 (stable) -Daily BMP -Continue to monitor renal function as patient now on vanc and zosyn  Hypoxia: Initially there was a concern for PE at patient's nursing facilityand she was started on empiric Eliquis.D-dimer was normal. Currently on RA. PE appears less likely at this time. Lung exam today showed no overt abnormalities, however it was limited due to poor respiratory effort. -Stopped eliquis -Monitor need for supplemental O2  Hypokalemia K went from 3.7-->3.3 on 09/18/19. Repleted with BID. -K 3.7 on 09/19/19  Hyperbilirubinemia TBili 1.5.possibly 2/2 dehydration. T Bili down to 1on 09/17/19. No further work [employer].  HTN -Continue home clonidine  Prior to Admission Living Arrangement: [facility] Anticipated Discharge Location: TBD, likely return to [facility] Barriers to Discharge: Continued medical management Dispo: Anticipated discharge in approximately 2-3 day(s).    Merrilyn Puma, MD 09/20/2019, 3:34 PM Pager: [phone] After 5pm on weekdays and 1pm on weekends: On Call pager [phone]

## 2019-09-20 NOTE — Progress Notes (Signed)
EEG Completed; Results Pending  

## 2019-09-20 NOTE — Procedures (Signed)
Patient Name: Judy Mooney  MRN: [id number]  Epilepsy Attending: Charlsie Quest  Referring Physician/Provider: Dr Reymundo Poll Date: 09/20/2019 Duration: 23.40 mins  Patient history: 79yo F with ams.EEG to evaluate for seizure.   Level of alertness:  lethargic  AEDs during EEG study: Ativan  Technical aspects: This EEG study was done with scalp electrodes positioned according to the 10-20 International system of electrode placement. Electrical activity was acquired at a sampling rate of 500Hz  and reviewed with a high frequency filter of 70Hz  and a low frequency filter of 1Hz . EEG data were recorded continuously and digitally stored.   Description: No posterior dominant rhythm. EEG showed continuous generalized 5 to 6 Hz theta as well as intermittent 2-3Hz  delta slowing. Hyperventilation and photic stimulation were not performed.     ABNORMALITY -Continuous slow, generalize   IMPRESSION: This study is suggestive of moderate diffuse encephalopathy, nonspecific etiology. No seizures or epileptiform discharges were seen throughout the recording.  Siobahn Worsley 

## 2019-09-20 NOTE — Plan of Care (Signed)

## 2019-09-21 ENCOUNTER — Inpatient Hospital Stay (HOSPITAL_COMMUNITY): Payer: PPO

## 2019-09-21 DIAGNOSIS — A419 Sepsis, unspecified organism: Secondary | ICD-10-CM | POA: Diagnosis not present

## 2019-09-21 DIAGNOSIS — G9340 Encephalopathy, unspecified: Secondary | ICD-10-CM | POA: Diagnosis not present

## 2019-09-21 DIAGNOSIS — N390 Urinary tract infection, site not specified: Secondary | ICD-10-CM | POA: Diagnosis not present

## 2019-09-21 DIAGNOSIS — N179 Acute kidney failure, unspecified: Secondary | ICD-10-CM | POA: Diagnosis not present

## 2019-09-21 LAB — CULTURE, BLOOD (ROUTINE X 2)
Culture: NO GROWTH
Culture: NO GROWTH

## 2019-09-21 LAB — CBC
HCT: 38.4 % (ref 36.0–46.0)
Hemoglobin: 11.8 g/dL — ABNORMAL LOW (ref 12.0–15.0)
MCH: 28.1 pg (ref 26.0–34.0)
MCHC: 30.7 g/dL (ref 30.0–36.0)
MCV: 91.4 fL (ref 80.0–100.0)
Platelets: 323 10*3/uL (ref 150–400)
RBC: 4.2 MIL/uL (ref 3.87–5.11)
RDW: 16.6 % — ABNORMAL HIGH (ref 11.5–15.5)
WBC: 11.2 10*3/uL — ABNORMAL HIGH (ref 4.0–10.5)
nRBC: 0 % (ref 0.0–0.2)

## 2019-09-21 LAB — BASIC METABOLIC PANEL
Anion gap: 10 (ref 5–15)
BUN: 15 mg/dL (ref 8–23)
CO2: 28 mmol/L (ref 22–32)
Calcium: 9.2 mg/dL (ref 8.9–10.3)
Chloride: 108 mmol/L (ref 98–111)
Creatinine, Ser: 1.53 mg/dL — ABNORMAL HIGH (ref 0.44–1.00)
GFR calc Af Amer: 37 mL/min — ABNORMAL LOW (ref >60)
GFR calc non Af Amer: 32 mL/min — ABNORMAL LOW (ref >60)
Glucose, Bld: 87 mg/dL (ref 70–99)
Potassium: 3.8 mmol/L (ref 3.5–5.1)
Sodium: 146 mmol/L — ABNORMAL HIGH (ref 135–145)

## 2019-09-21 LAB — VANCOMYCIN, RANDOM: Vancomycin Rm: 33

## 2019-09-21 MED ORDER — LORAZEPAM 2 MG/ML IJ SOLN
1.0000 mg | Freq: Four times a day (QID) | INTRAMUSCULAR | Status: DC
  Administered 2019-09-22 (×2): 1 mg via INTRAVENOUS
  Filled 2019-09-21 (×2): qty 1

## 2019-09-21 MED ORDER — SODIUM CHLORIDE 0.9 % IV BOLUS
500.0000 mL | Freq: Once | INTRAVENOUS | Status: AC
  Administered 2019-09-21: 500 mL via INTRAVENOUS

## 2019-09-21 MED ORDER — SODIUM CHLORIDE 0.9% FLUSH: 10.0000 mL | INTRAVENOUS | Status: DC | PRN

## 2019-09-21 MED ORDER — ENOXAPARIN SODIUM 30 MG/0.3ML ~~LOC~~ SOLN
30.0000 mg | SUBCUTANEOUS | Status: DC
  Administered 2019-09-21 – 2019-09-24 (×4): 30 mg via SUBCUTANEOUS
  Filled 2019-09-21 (×4): qty 0.3

## 2019-09-21 MED ORDER — FUROSEMIDE 10 MG/ML IJ SOLN
40.0000 mg | Freq: Once | INTRAMUSCULAR | Status: AC
  Administered 2019-09-21: 40 mg via INTRAVENOUS
  Filled 2019-09-21: qty 4

## 2019-09-21 MED ORDER — LORAZEPAM 2 MG/ML IJ SOLN
1.0000 mg | Freq: Once | INTRAMUSCULAR | Status: AC
  Administered 2019-09-21: 1 mg via INTRAVENOUS
  Filled 2019-09-21: qty 1

## 2019-09-21 MED ORDER — SODIUM CHLORIDE 0.9% FLUSH
10.0000 mL | Freq: Two times a day (BID) | INTRAVENOUS | Status: DC
  Administered 2019-09-21 – 2019-09-23 (×3): 10 mL

## 2019-09-21 NOTE — Progress Notes (Signed)
Nutrition Follow-up  DOCUMENTATION CODES:   Not applicable  INTERVENTION:   -RD will follow for diet advancement and add supplements as appropriate -If pt remains unable to take PO diet, consider initiation of nutrition support:  Initiate Osmolite 1.2 @ 25 ml/hr  increase by 10 ml every 8 hours to goal rate of 55 ml/hr.   Tube feeding regimen provides 1584 kcal (100% of needs), 73 grams of protein, and 1082 ml of H2O.   NUTRITION DIAGNOSIS:   Inadequate oral intake related to decreased appetite as evidenced by per patient/family report.  Ongoing  GOAL:   Patient will meet greater than or equal to 90% of their needs  Unmet  MONITOR:   PO intake, Supplement acceptance, Labs, Weight trends  REASON FOR ASSESSMENT:   Consult Assessment of nutrition requirement/status  ASSESSMENT:   79 yo female admitted with sepsis secondary to UTI, acute on chronic renal failure. PMH includes CKD III, HTN, CAD, recurrent UTI  7/21- s/p BSE- recommend NPO  Reviewed I/O's: -500 ml x 24 hours and +3.2 L since admission  UOP: 500 ml x 24 hours  Pt very somnolent at time of visit. She did not respond to voice or touch.   Pt with minimal intake since admission; noted meal completion 10-20%. Pt now NPO per SLP recommendations.  Case discussed with MD regarding pt's nutritional status. RD suggested placement of small bore feeding tube, however, MD would like to hold off on placement, stating mentation has improved and would like to see how she does over the past few days.   Medications reviewed and include ativan.   Labs reviewed.   NUTRITION - FOCUSED PHYSICAL EXAM:    Most Recent Value  Orbital Region No depletion  Upper Arm Region Moderate depletion  Thoracic and Lumbar Region No depletion  Buccal Region No depletion  Temple Region No depletion  Clavicle Bone Region No depletion  Clavicle and Acromion Bone Region No depletion  Scapular Bone Region No depletion  Dorsal Hand Mild  depletion  Patellar Region No depletion  Anterior Thigh Region No depletion  Posterior Calf Region No depletion  Edema (RD Assessment) Mild  Hair Reviewed  Eyes Reviewed  Mouth Reviewed  Skin Reviewed  Nails Reviewed       Diet Order:   Diet Order            Diet NPO time specified Except for: Ice Chips, Sips with Meds  Diet effective now                 EDUCATION NEEDS:   Not appropriate for education at this time  Skin:  Skin Assessment: Reviewed RN Assessment  Last BM:  09/18/19  Height:   Ht Readings from Last 1 Encounters:  09/18/19 5' (1.524 m)    Weight:   Wt Readings from Last 1 Encounters:  09/18/19 76.6 kg    Ideal Body Weight:  45.5 kg  BMI:  Body mass index is 32.98 kg/m.  Estimated Nutritional Needs:   Kcal:  1550-1700 kcals  Protein:  70-80 g  Fluid:  >/= 1.5 L    Levada Schilling, RD, LDN, CDCES Registered Dietitian II Certified Diabetes Care and Education Specialist Please refer to [facility] for RD and/or RD on-call/weekend/after hours pager

## 2019-09-21 NOTE — Progress Notes (Signed)
Subjective: Examined patient at bedside.  Is able to open eyes as soon as we entered room. Appears to have had something to eat as there is food on her lips. She started coughing likely due to not being able to tolerate PO intake.  States no when asked if she is in pain. Is able to speak a little, albeit incoherently. Still much improved on clinical exam than yesterday.    Objective:  Vital signs in last 24 hours: Vitals:   09/20/19 1948 09/21/19 0356 09/21/19 0749 09/21/19 1326  BP: (!) 158/116 122/84 134/90 (!) 131/47  Pulse: 98 (!) 103 94 98  Resp: 18  16 17   Temp: 98.8 F (37.1 C) 97.6 F (36.4 C) 97.9 F (36.6 C) 97.7 F (36.5 C)  TempSrc: Oral Oral Oral Oral  SpO2: 97% 100% 100% 92%  Weight:      Height:       Physical Exam: General: Elderly female, ill-appearing, lying in bed. Eyes: Dilated pupils, moderately responsive to light. CV: Regular rate and normal rhythm. No m/r/g Pulm: Limited due to poor respiratory effort. Satting 100% on 2L [place]. No overtly adventitious sounds noted. Neuro: Unable to fully assess as patient is unable to follow commands. Opens eyes without being prompted and is able to speak incoherently, which is improved from yesterday.  Assessment/Plan:  Active Problems:   Sepsis (HCC)   Acute on chronic renal failure (HCC)   Urinary tract infection   Chronic kidney disease, stage 3a   Acute encephalopathy   Benzodiazepine withdrawal with delirium [facility])  Patient is an 79 year old female with history of CKD stage 3, CAD, HTN, and recurrent UTI's (on keflex), with recent hospitalization for CAP and acute hypoxic respiratory failure who presented with increased SOB and hypoxia from her SNF, found to haveacute encephalopathy 2/2 sepsis likely 2/2 acute complicated cystitis.   Acute Encephalopathy: Sepsis 2/2 UTIfrom Citrobacter freundii and E facecalis: Benzodiazepine Withdrawal 2/2 chronic xanax use History of recurrent UTIs and recent history  of CAP. Noted to have low grade fevers, tachycardia, leukocytosis, and many bacteria on UA.She was noted to be hypoxic and was on oxygen however she no longer is requiring this. CXR was unremarkable so recurrent pneumonia appears less likely at this time.She hadsuprapubic tenderness on exam.Urine culture positive for Citrobacter freundii and E faecalis, resistant to cefazolin and ceftriaxone. WBCs increased from 15.8 to 15.1. Initiallyon vanc and cefepime, but switchedcefepime to zosyn for better coverage for citrobacter. -On 09/19/19, patient appears more altered. WBCs increased from 15.5 to 15.8. Continues to be tachycardic and tachypneic. She appears agitated on exam and pupils are dilated and unresponsive to light. Concern for benzodiazepine withdrawal due to history of xanax use, as per daughter. Given 0.5mg  IV ativan dose, which calmed the patient, but did not lead to improved mental status. STAT CT head w/o contrast ordered, showed no acute intracranial abnormality. Patient's daughter, 09/21/19, consented for LP so orders were placed, but LP held until we reassess in the morning. -On 09/20/19, patient initially appeared similar to yesterday. However, after administration of IV ativan 0.5mg , patient's status appears to have improved. Is able to open eyes to voice and make noises in response to questions. Still remains altered compared to baseline. Due to clinical improvement with ativan, discontinued LP for now. Will continue scheduled IV ativan 0.5mg  q6h and CIWA without ativan. -On 09/21/19, patient improved compared to yesterday. CIWA scores repeatedly around 11 so will continue IV ativan 0.5mg  q6h for now. Can decrease ativan dose  once CIWA scores are between 0-4.  -Discontinued cefepime -EEG suggestive of moderate diffuse encephalopathy, but no seizures or epileptiform discharges seen. -on delirium precautions -Blood cultures negative -CT head w/o contrast showed no acute intracranial  abnormality -Daily CBC, BMP -Continue vanc and zosyn --> monitor renal function -Continue IV fluids -On 09/20/19, given 1L NS bolus and 1L NS infusion at 100cc/hr -On 09/21/19, given 500cc NS bolus due to suspected prerenal AKI -Continue IV ativan 0.5mg  q6h -CIWA without ativan -SLP evaluated --> recommend NPO, ice chips, sips with meds.   Acute on chronic renal failure: Cr up to 1.4 from baseline of around 0.7. Concern for pre-renal azotemia given decreased PO intake.Cr improved after fluid administration Cr 1.16 -->1.18-->1.14. On 09/21/19, creatinine increased from Cr 1.14 --> Cr 1.53. Ordered CXR to look for possible pulmonary edema from fluids, but CXR unchanged from priors with no pulmonary edema seen. Ordered CT renal stone study, showed 1.7cm exophytic lesion on lower pole of left kidney, but no evidence of urinary stones or changes in either kidney or ureter. Ordered MRI Abd Pel w/ and w/out contrast to further evaluate exophytic lesion to rule out neoplasm. Ordered urine osm, urine sodium, and urine creatinine to evaluate cause of AKI although likely prerenal 2/2 dehydration due to decreased PO intake. Given IV 500cc NS bolus on top of 100cc/hr LR infusion. Worsening kidney function could be 2/2 vanc and zosyn, will monitor response to fluids first. -F/u MRI abdomen and pelvis -f/u urine osm, urine sodium, urine creatinine -Strict I/O's -Daily BMP -Continue to monitor renal function as patient on vanc and zosyn  Hypoxia: Initially there was a concern for PE at patient's nursing facilityand she was started on empiric Eliquis.D-dimer was normal. Currently on RA. PE appears less likely at this time. Lung exam today showed no overt abnormalities, however it was limited due to poor respiratory effort. -Stopped eliquis -Satting 100% on 2L [place] -CXR on 09/21/19 unchanged from priors. R base atelectasis/infiltrate with tiny R pleural effusion.  Hypokalemia K went from 3.7-->3.3 on  09/18/19.Repletedwith BID. -K 3.7 on 09/19/19  Hyperbilirubinemia TBili 1.5.possibly 2/2 dehydration. T Bili down to 1on 09/17/19. No further work [employer].  HTN -Continue home clonidine  Prior to Admission Living Arrangement: [facility] Anticipated Discharge Location: TBD, likely [facility] Barriers to Discharge: Continued medical management Dispo: Anticipated discharge in approximately 2-3 day(s).   Merrilyn Puma, MD 09/21/2019, 3:35 PM Pager: [phone] After 5pm on weekdays and 1pm on weekends: On Call pager [phone]

## 2019-09-21 NOTE — Evaluation (Signed)
Clinical/Bedside Swallow Evaluation Patient Details  Name: Judy Mooney MRN: [id number] Date of Birth: 30-Aug-1940  Today's Date: 09/21/2019 Time: SLP Start Time (ACUTE ONLY): 1020 SLP Stop Time (ACUTE ONLY): 1036 SLP Time Calculation (min) (ACUTE ONLY): 16 min  Past Medical History:  Past Medical History:  Diagnosis Date  . Anxiety   . Asthma   . Coronary artery disease   . GERD (gastroesophageal reflux disease)   . Glaucoma   . Hypertension   . Pneumonia   . Renal disorder   . Respiratory failure (HCC)   HPI:  Patient is an 79 year old female with history of CKD stage 3, CAD, HTN, and recurrent UTI's (on keflex), with recent hospitalization for CAP and acute hypoxic respiratory failure who presented with increased SOB and hypoxia from her SNF, found to have acute encephalopathy 2/2 sepsis likely 2/2 acute complicated cystitis.    Assessment / Plan / Recommendation Clinical Impression  Pt initially presented with decreased arousal and suspicion for vomit in her mouth. Denture removed, oral care given and suction set up. Pt awakened more appropriately and wanted sips of water. With hand over hand assist given pt accepted cup and straw sips of water. There was concern for ill-timed swallowing, aspiration and even possible regurgitation. When asked if she has trouble swallowing and regurgitating at baseline she said yes but may not be an accurate historian. Though pt swallowed puree easily, there was again a wet belch sound after the swallow. For now, it would be best for pt to remain NPO except for ice chips until arousal and mentation are much improved. She may need a modified barium swallow to determine diet. Will f/u.  SLP Visit Diagnosis: Dysphagia, unspecified (R13.10)    Aspiration Risk  Severe aspiration risk    Diet Recommendation NPO;Ice chips PRN after oral care;NPO except meds        Other  Recommendations     Follow up Recommendations Skilled Nursing facility       Frequency and Duration            Prognosis        Swallow Study   General HPI: Patient is an 79 year old female with history of CKD stage 3, CAD, HTN, and recurrent UTI's (on keflex), with recent hospitalization for CAP and acute hypoxic respiratory failure who presented with increased SOB and hypoxia from her SNF, found to have acute encephalopathy 2/2 sepsis likely 2/2 acute complicated cystitis.  Type of Study: Bedside Swallow Evaluation Diet Prior to this Study: NPO History of Recent Intubation: No Behavior/Cognition: Lethargic/Drowsy Oral Cavity Assessment: Excessive secretions Oral Care Completed by SLP: Yes Oral Cavity - Dentition: Edentulous;Dentures, top;Dentures, bottom (in cup) Vision: Functional for self-feeding Self-Feeding Abilities: Total assist Patient Positioning: Upright in bed Baseline Vocal Quality: Low vocal intensity Volitional Cough: Weak Volitional Swallow: Able to elicit    Oral/Motor/Sensory Function Overall Oral Motor/Sensory Function: Generalized oral weakness   Ice Chips Ice chips: Impaired Presentation: Spoon Oral Phase Functional Implications: Prolonged oral transit Pharyngeal Phase Impairments: Suspected delayed Swallow   Thin Liquid Thin Liquid: Impaired Presentation: Spoon;Cup;Straw Oral Phase Functional Implications: Prolonged oral transit Pharyngeal  Phase Impairments: Suspected delayed Swallow;Cough - Immediate;Multiple swallows    Nectar Thick Nectar Thick Liquid: Not tested   Honey Thick Honey Thick Liquid: Not tested   Puree Puree: Impaired Pharyngeal Phase Impairments: Wet Vocal Quality   Solid     Solid: Not tested      Kemal Amores, Riley Nearing 09/21/2019,10:43 AM

## 2019-09-21 NOTE — Progress Notes (Signed)
Paged by nurse about patient going into SVT, sustained for 8-10 minutes and that resolved on it's own, patient had no changes in mental status or vitals during the episode. Obtained stat EKG, this showed sinus tachycardia with atrial complexes, HR 107, no ST changes noted. Patient was laying in bed on evaluation, she was able to answer some questions. Denied any shortness of breath, chest pain, or other symptoms. Daughter at bedside who reported that patient was talking to her a little bit however still not at her baseline, she reported that patient appeared more comfortable today.  On exam she was laying flat, was on 2L [place] with pulse ox initially reading at 82% (this was exchanged and O2 sat increased to 100%), cardiac exam showed tachycardia, pulmonary exam showed bibasilar crackles, trace BL LE swelling.  Patient is currently being treated for benzodiazepine withdrawal, polymicrobial UTI, and an AKI. She is currently on ativan 0.5 mg q6 daily for the benzo withdrawal. And has been receiving IV fluids for her AKI. Earlier today she was noted to be coughing and was placed on 2L [place], there was a concern about aspiration pneumonia so an CXR was obtained. This showed no acute changes, with tiny right pleural effusion. At this time it is unclear what caused her SVT. She does appear to be volume overloaded on exam which could have contributed. She also may not be getting enough ativan for her withdrawal, she was having elevated CIWA scores earlier today. Will increase ativan dose, check electrolytes, and treat volume overload.   -Lasix 40 mg IV once -Echocardiogram -Stat CMP and Mag -Ativan 1 mg IV once -Increase ativan to 1 mg q6 hours

## 2019-09-21 NOTE — Progress Notes (Signed)
Pharmacy Antibiotic Note  Judy Mooney is a 79 y.o. female admitted on Sep 19, 2019 with pneumonia. Patient now with metabolic encephalopathy and sepsis 2/2 polymicrobial UTI. Pharmacy has been consulted for Vancomycin dosing.   Today, patient remains afebrile and WBC down to 11.2. SCr worsening, up 1.53 from 1.14 previous. Vanc level drawn this morning at 33 (down from 38 yesterday).   Height: 5' (152.4 cm) Weight: 76.6 kg (168 lb 14 oz) IBW/kg (Calculated) : 45.5  Temp (24hrs), Avg:98 F (36.7 C), Min:97.6 F (36.4 C), Max:98.8 F (37.1 C)  Recent Labs  Lab 09/19/2019 1705 09/19/2019 1725 09/19/2019 1725 09/17/19 0749 09/18/19 0226 09/19/19 0950 09/20/19 0520 09/21/19 0319  WBC  --  16.3*   < > 12.8* 15.5* 15.8* 15.1* 11.2*  CREATININE  --  1.41*  --  1.16* 1.18* 1.14*  --  1.53*  LATICACIDVEN 1.4  --   --   --   --   --   --   --   VANCOTROUGH  --   --   --   --   --   --  50*  --   VANCORANDOM  --   --   --   --   --   --   --  33   < > = values in this interval not displayed.    Estimated Creatinine Clearance: 26.8 mL/min (A) (by C-G formula based on SCr of 1.53 mg/dL (H)).    Allergies  Allergen Reactions  . Sulfa Antibiotics   . Tetracyclines & Related   . Trimethoprim     Antimicrobials this admission: 7/16 Cefepime >> 7/18 7/16 Vancomycin >>  7/18 Zosyn >>  Microbiology results: 7/16 Urine: Citrobacter (R to cephalosporins, S to Zosyn), Enterococcus faecalis (S to vanc, amp) 7/16 Blood: No growth 7/17 MRSA PCR neg  Plan:  - Continue to hold vancomycin due to persistent elevated trough - Draw vanc random 7/23 with morning labs to determine when to redose (redose when <20). - Monitor renal function and urine output, clinical progress, abx plan.   Harlow Mares, PharmD Clinical Pharmacist  09/21/2019   9:49 AM   Please check AMION for all [facility] Pharmacy phone numbers After 10:00 PM, call the Main Pharmacy [phone]

## 2019-09-22 ENCOUNTER — Inpatient Hospital Stay (HOSPITAL_COMMUNITY): Payer: PPO

## 2019-09-22 DIAGNOSIS — G9340 Encephalopathy, unspecified: Secondary | ICD-10-CM | POA: Diagnosis not present

## 2019-09-22 DIAGNOSIS — I472 Ventricular tachycardia: Secondary | ICD-10-CM

## 2019-09-22 DIAGNOSIS — A419 Sepsis, unspecified organism: Secondary | ICD-10-CM | POA: Diagnosis not present

## 2019-09-22 DIAGNOSIS — N390 Urinary tract infection, site not specified: Secondary | ICD-10-CM | POA: Diagnosis not present

## 2019-09-22 DIAGNOSIS — N179 Acute kidney failure, unspecified: Secondary | ICD-10-CM | POA: Diagnosis not present

## 2019-09-22 LAB — ECHOCARDIOGRAM COMPLETE
Area-P 1/2: 4.31 cm2
Height: 60 in
S' Lateral: 2.2 cm
Weight: 2701.96 oz

## 2019-09-22 LAB — BASIC METABOLIC PANEL
Anion gap: 13 (ref 5–15)
BUN: 25 mg/dL — ABNORMAL HIGH (ref 8–23)
CO2: 22 mmol/L (ref 22–32)
Calcium: 8.6 mg/dL — ABNORMAL LOW (ref 8.9–10.3)
Chloride: 112 mmol/L — ABNORMAL HIGH (ref 98–111)
Creatinine, Ser: 2.58 mg/dL — ABNORMAL HIGH (ref 0.44–1.00)
GFR calc Af Amer: 20 mL/min — ABNORMAL LOW (ref >60)
GFR calc non Af Amer: 17 mL/min — ABNORMAL LOW (ref >60)
Glucose, Bld: 88 mg/dL (ref 70–99)
Potassium: 3.8 mmol/L (ref 3.5–5.1)
Sodium: 147 mmol/L — ABNORMAL HIGH (ref 135–145)

## 2019-09-22 LAB — URINALYSIS, COMPLETE (UACMP) WITH MICROSCOPIC
Bilirubin Urine: NEGATIVE
Glucose, UA: NEGATIVE mg/dL
Ketones, ur: NEGATIVE mg/dL
Nitrite: NEGATIVE
Protein, ur: >=300 mg/dL — AB
Specific Gravity, Urine: 1.016 (ref 1.005–1.030)
WBC, UA: >50 WBC/hpf — ABNORMAL HIGH (ref 0–5)
pH: 5 (ref 5.0–8.0)

## 2019-09-22 LAB — COMPREHENSIVE METABOLIC PANEL
ALT: 11 U/L (ref 0–44)
AST: 18 U/L (ref 15–41)
Albumin: 2.2 g/dL — ABNORMAL LOW (ref 3.5–5.0)
Alkaline Phosphatase: 35 U/L — ABNORMAL LOW (ref 38–126)
Anion gap: 10 (ref 5–15)
BUN: 20 mg/dL (ref 8–23)
CO2: 27 mmol/L (ref 22–32)
Calcium: 8.8 mg/dL — ABNORMAL LOW (ref 8.9–10.3)
Chloride: 109 mmol/L (ref 98–111)
Creatinine, Ser: 2.18 mg/dL — ABNORMAL HIGH (ref 0.44–1.00)
GFR calc Af Amer: 24 mL/min — ABNORMAL LOW (ref >60)
GFR calc non Af Amer: 21 mL/min — ABNORMAL LOW (ref >60)
Glucose, Bld: 97 mg/dL (ref 70–99)
Potassium: 2.8 mmol/L — ABNORMAL LOW (ref 3.5–5.1)
Sodium: 146 mmol/L — ABNORMAL HIGH (ref 135–145)
Total Bilirubin: 1.7 mg/dL — ABNORMAL HIGH (ref 0.3–1.2)
Total Protein: 5.4 g/dL — ABNORMAL LOW (ref 6.5–8.1)

## 2019-09-22 LAB — PROTEIN / CREATININE RATIO, URINE
Creatinine, Urine: 72.25 mg/dL
Protein Creatinine Ratio: 5.05 mg/mg{Cre} — ABNORMAL HIGH (ref 0.00–0.15)
Total Protein, Urine: 365 mg/dL

## 2019-09-22 LAB — CBC
HCT: 35.1 % — ABNORMAL LOW (ref 36.0–46.0)
Hemoglobin: 10.3 g/dL — ABNORMAL LOW (ref 12.0–15.0)
MCH: 27.4 pg (ref 26.0–34.0)
MCHC: 29.3 g/dL — ABNORMAL LOW (ref 30.0–36.0)
MCV: 93.4 fL (ref 80.0–100.0)
Platelets: 310 10*3/uL (ref 150–400)
RBC: 3.76 MIL/uL — ABNORMAL LOW (ref 3.87–5.11)
RDW: 17 % — ABNORMAL HIGH (ref 11.5–15.5)
WBC: 12.4 10*3/uL — ABNORMAL HIGH (ref 4.0–10.5)
nRBC: 0 % (ref 0.0–0.2)

## 2019-09-22 LAB — MAGNESIUM: Magnesium: 2 mg/dL (ref 1.7–2.4)

## 2019-09-22 LAB — OSMOLALITY, URINE: Osmolality, Ur: 320 mOsm/kg (ref 300–900)

## 2019-09-22 LAB — SODIUM, URINE, RANDOM: Sodium, Ur: 47 mmol/L

## 2019-09-22 LAB — CREATININE, URINE, RANDOM: Creatinine, Urine: 73.6 mg/dL

## 2019-09-22 LAB — PHOSPHORUS: Phosphorus: 4.3 mg/dL (ref 2.5–4.6)

## 2019-09-22 MED ORDER — SODIUM CHLORIDE 0.9 % IV BOLUS
1000.0000 mL | Freq: Once | INTRAVENOUS | Status: AC
  Administered 2019-09-22: 1000 mL via INTRAVENOUS

## 2019-09-22 MED ORDER — PIPERACILLIN-TAZOBACTAM IN DEX 2-0.25 GM/50ML IV SOLN
2.2500 g | Freq: Three times a day (TID) | INTRAVENOUS | Status: DC
  Administered 2019-09-22 – 2019-09-24 (×4): 2.25 g via INTRAVENOUS
  Filled 2019-09-22 (×6): qty 50

## 2019-09-22 MED ORDER — SODIUM CHLORIDE 0.9 % IV BOLUS
500.0000 mL | Freq: Once | INTRAVENOUS | Status: AC
  Administered 2019-09-22: 500 mL via INTRAVENOUS

## 2019-09-22 MED ORDER — SODIUM CHLORIDE 0.9 % IV BOLUS: 1000.0000 mL | Freq: Once | INTRAVENOUS | Status: DC

## 2019-09-22 MED ORDER — LORAZEPAM 2 MG/ML IJ SOLN
0.5000 mg | Freq: Four times a day (QID) | INTRAMUSCULAR | Status: DC
  Administered 2019-09-22 – 2019-09-25 (×9): 0.5 mg via INTRAVENOUS
  Filled 2019-09-22 (×9): qty 1

## 2019-09-22 MED ORDER — POTASSIUM CHLORIDE 10 MEQ/100ML IV SOLN
10.0000 meq | INTRAVENOUS | Status: AC
  Administered 2019-09-22 (×6): 10 meq via INTRAVENOUS
  Filled 2019-09-22 (×5): qty 100

## 2019-09-22 MED ORDER — POTASSIUM CHLORIDE 20 MEQ PO PACK: 40.0000 meq | PACK | ORAL | Status: DC

## 2019-09-22 NOTE — Progress Notes (Signed)
Notified Md that patient went to MRI without IV access, MD stated that it was ok at this moment to do so. 

## 2019-09-22 NOTE — Progress Notes (Signed)
PHARMACY NOTE:  ANTIMICROBIAL RENAL DOSAGE ADJUSTMENT  Current antimicrobial regimen includes a mismatch between antimicrobial dosage and estimated renal function.  As per policy approved by the Pharmacy & Therapeutics and Medical Executive Committees, the antimicrobial dosage will be adjusted accordingly.  Current antimicrobial dosage:  Piperacillin/tazobactam 3.375g q8h  Indication: Urinary tract infection  Renal Function:   Estimated Creatinine Clearance: 18.8 mL/min (A) (by C-G formula based on SCr of 2.18 mg/dL (H)). []      On intermittent HD, scheduled: []      On CRRT    Antimicrobial dosage has been changed to:  Piperacillin/tazobactam 2.25g q8h  Additional comments: Given patients current AKI and nephrotoxic potential of vancomycin and piperacillin/tazobactam combination, discussed with team appropriateness of continuing vancomycin. Culture results show Enterococcus species is sensitive to ampicillin, and CLSI states piperacillin/tazobactam sensitivities to Enterococcus may be predicted from ampicillin sensitivities. Will discontinue vancomycin per discussion with team in order to limit potential nephrotoxicity as well as continue to provide full coverage of organisms in urine culture with piperacillin/tazobactam.  Thank you for allowing pharmacy to be a part of this patient's care.  , PharmD PGY2 ID Pharmacy Resident [id number]  09/22/2019 12:57 PM

## 2019-09-22 NOTE — Progress Notes (Signed)
1750 Pt opens eyes only with verbal stimuli. V/S checked manually. Left arm is weeping, with mod amount of sanguinous drainage, bil arm edema noted. Dr Gwyneth Revels and Dr Austin Miles is here and assessed the pt.

## 2019-09-22 NOTE — Progress Notes (Signed)
  Speech Language Pathology Treatment: Dysphagia  Patient Details Name: Judy Mooney MRN: [id number] DOB: [birth year] Today's Date: 09/22/2019 Time: 3662-9476 SLP Time Calculation (min) (ACUTE ONLY): 14 min  Assessment / Plan / Recommendation Clinical Impression  Pt is more alert this am, but with short effortful breaths. RN reports she has had significantly edematous tissue, tachycardia. Oral exam revealed signs of thrush. Pt was given a bite of ice with insufficient laryngeal movement subjectively for a true swallow, followed by coughing. Again subjectively, laryngeal movement improved with a neutral, slightly head down posture. Pt was able to tolerate a few pieces of ice this was, but when given a small sip of water, there was audible multiple swallow with immediate cough response. Pt definitely has some dysphagia. Unsure if acute vs chronic. She will need MBS, but she is not stable enough to attempt this today. Will f/u tomorrow. Recommend keeping pt NPO unless family would like to consider POs with known risk for pts comfort.   HPI HPI: Patient is an 79 year old female with history of CKD stage 3, CAD, HTN, and recurrent UTI's (on keflex), with recent hospitalization for CAP and acute hypoxic respiratory failure who presented with increased SOB and hypoxia from her SNF, found to have acute encephalopathy 2/2 sepsis likely 2/2 acute complicated cystitis.       SLP Plan  Continue with current plan of care       Recommendations  Diet recommendations: NPO                Oral Care Recommendations: Oral care QID Follow up Recommendations: Skilled Nursing facility SLP Visit Diagnosis: Dysphagia, unspecified (R13.10) Plan: Continue with current plan of care       GO               Harlon Ditty, MA CCC-SLP  Acute Rehabilitation Services Pager [phone] Office [phone]  Claudine Mouton 09/22/2019, 9:20 AM

## 2019-09-22 NOTE — Progress Notes (Signed)
  Echocardiogram 2D Echocardiogram has been performed.  Judy Mooney G Memori Sammon 09/22/2019, 8:45 AM

## 2019-09-22 NOTE — Progress Notes (Signed)
[facility]  Chart reviewed, request for central access. PCCM available to place CVL, but patient will need to be monitored for procedure and family needs to give consent.  Given the complex nature of this patient's overall illness (DNR status, dementia etc) I feel it is more appropriate for the primary to discuss the nature of the procedure with patient's family and obtain consent.  If family consents then we will be happy to place central line.  Heber [place], MD [place] [facility] Pager: [phone] Cell: [phone] If no response, call [phone]

## 2019-09-22 NOTE — Progress Notes (Signed)
Pt needed a straight cath over night where the nurse where they removed 600 ml of urine per report. The primary doctor was at bedside this AM and I asked for an order for Foley catheter. MD placed order for Foley catheter and it was placed, pt tolerated well. After Foley insertion, pt voided 30 mL of urine which was sent to lab for culture per order.

## 2019-09-22 NOTE — Progress Notes (Signed)
Subjective: Examined patient at bedside.  Appears much improved from yesterday. More interactive at this time, is able to say some words like "okay" and "thank you" and speaks, albeit incoherently. Appears clinically improved from yesterday.    Addendum: Paged at around 1630 by patient's nurse, Judy Mooney, about patient's midline being infiltrated after receiving about of fluid, with about infiltrating into left arm. Placed another midline in opposite arm (right side). Given 0.5mg  ativan IV and received about 100-112mL of fluid before line became infiltrated again. On exam, patient appears worse than when we saw her in the morning. Still satting well and cardiac and pulm exam appears unchanged from earlier today. Pupils less responsive to light, but she is able to slightly open eyes when asked. Not interactive on exam, is not speaking or mumbling. No available access at this time. Bilateral arms edematous from infiltration. Vascular access team stated PICC line would not be able to be placed due to decreased creatinine clearance (<15). Consulted PCCM about central line placement, PA Judy Mooney stated that she would notify the night team. Will be unable to administer IV antibiotics until line is placed for sepsis 2/2 polymicrobial UTI. Patient taken for MRI without contrast for further evaluation of AKI. Unable to give any fluids at this time. Will reassess in the morning.  Objective:  Vital signs in last 24 hours: Vitals:   09/21/19 1326 09/21/19 2044 09/22/19 0300 09/22/19 0732  BP: (!) 131/47 (!) 131/51 (!) 111/55 130/73  Pulse: 98 96 92 99  Resp: 17 16 15 17   Temp: 97.7 F (36.5 C) 98.5 F (36.9 C) 98 F (36.7 C) 98 F (36.7 C)  TempSrc: Oral Axillary Axillary Oral  SpO2: 92% 95% 92%   Weight:      Height:       Physical Exam: General: Elderly female, ill-appearing, lying in bed. Eyes: Less dilated pupils than yesterday, moderately responsive to light. CV: Regular rate and  normal rhythm. No m/r/g. Pulm: Limited d/t poor respiratory effort. Satting 98% on 2L [place]. No overtly adventitious sounds noted.  Neuro: Unable to fully assess as patient is unable to follow commands. However, opens eyes without being prompted and is more interactive on exam, improved from yesterday. Is able to squeeze my fingers to command with right hand, not with left.   Assessment/Plan:  Active Problems:   Sepsis (HCC)   Acute on chronic renal failure (HCC)   Urinary tract infection   Chronic kidney disease, stage 3a   Acute encephalopathy   Benzodiazepine withdrawal with delirium [name])  Patient is an 79 year old female with history of CKD stage 3, CAD, HTN, and recurrent UTI's (on keflex), with recent hospitalization for CAP and acute hypoxic respiratory failure who presented with increased SOB and hypoxia from her SNF, found to haveacute encephalopathy 2/2sepsis likely 2/2 acute complicated cystitis vs benzodiazepine withdrawal.   Acute Encephalopathy: Sepsis 2/2 UTIfrom Citrobacter freundii and E facecalis: Benzodiazepine Withdrawal 2/2 chronic xanax use History of recurrent UTIs and recent history of CAP. Noted to have low grade fevers, tachycardia, leukocytosis, and many bacteria on UA.She was noted to be hypoxic and was on oxygen however she no longer is requiring this. CXR was unremarkable so recurrent pneumonia appears less likely at this time.She hadsuprapubic tenderness on exam.Urine culture positive for Citrobacter freundii and E faecalis, resistant to cefazolin and ceftriaxone. WBCs increased from 15.8 to 15.1. Initiallyon vanc and cefepime, but switchedcefepime to zosyn for better coverage for citrobacter. -On 09/19/19, patient appears more altered.  WBCs increased from 15.5 to 15.8. Continues to be tachycardic and tachypneic. She appears agitated on exam and pupils are dilated and unresponsive to light.Concern for benzodiazepine withdrawal due to history of xanax use,  as per daughter. Given 0.5mg  IV ativan dose, which calmed the patient, but did not lead to improved mental status. STAT CT head w/o contrast ordered, showed no acute intracranial abnormality. Patient's daughter, Judy Mooney, consented for LP so orders were placed, but LP held until we reassess in the morning. -On 09/20/19, patient initially appeared similar to yesterday. However, after administration of IV ativan 0.5mg , patient's status appears to have improved. Is able to open eyes to voice and make noises in response to questions. Still remains altered compared to baseline. Due to clinical improvement with ativan, discontinued LP for now. Will continue scheduledIV ativan 0.5mg  q6h and CIWA without ativan. -On 09/21/19, patient improved compared to yesterday. CIWA scores repeatedly around 11 so will continue IV ativan 0.5mg  q6h for now. Can decrease ativan dose once CIWA scores are between 0-4. Run of SVTs, self-resolved. -On 09/22/19, patient initially improved. However, in the late afternoon, patients condition declined. Patient has been encephalopathic over the past few days with waxing and waning presentation, usually with improvement in the morning and worsening later in the day. Midline infiltrated in left arm after receiving about of fluid and new midline in the right arm also infiltrated after dose of ativan and about 100-127mL given. Vascular access team reported inability to place PICC line due to decreased creatinine clearance (<15). PCCM consulted for placement of central line, PA Judy Mooney stated that she will notify night team. Patient taken for MRI abdomen w/o contrast. Will reassess in the AM.  -Discontinued cefepime -EEG suggestive of moderate diffuse encephalopathy, but no seizures or epileptiform discharges seen. -on delirium precautions -Blood cultures negative -CT head w/o contrast showed no acute intracranial abnormality -Daily CBC, BMP -Continue vanc and zosyn --> monitor renal  function -Continue IV fluids -Giving 1L NS --> line infiltrated, obtained about total --> about infiltrated into right arm. -Continue IV ativan 0.5mg  q6h -CIWA without ativan -SLP evaluated --> recommend NPO, ice chips, sips with meds. -Unable to administer IV fluids for likely dehydration, IV ativan for likely benzo withdrawal, or IV abx for sepsis 2/2 polymicrobial UTI without line access.   Acute on chronic renal failure: Cr up to 1.4 from baseline of around 0.7. Concern for pre-renal azotemia given decreased PO intake.Cr improved after fluid administration Cr 1.16 -->1.18-->1.14.  -On 09/21/19, creatinine increased from Cr 1.14 --> Cr 1.53. Ordered CXR to look for possible pulmonary edema from fluids, but CXR unchanged from priors with no pulmonary edema seen. Ordered CT renal stone study, showed 1.7cm exophytic lesion on lower pole of left kidney, but no evidence of urinary stones or changes in either kidney or ureter. Ordered MRI Abd Pel w/ and w/out contrast to further evaluate exophytic lesion to rule out neoplasm. Ordered urine osm, urine sodium, and urine creatinine to evaluate cause of AKI although likely prerenal 2/2 dehydration due to decreased PO intake. Given IV 500cc NS bolus on top of 100cc/hr LR infusion. Worsening kidney function could be 2/2 vanc and zosyn, will monitor response to fluids first. -On 09/22/19, creatinine increased from Cr 1.53 --> Cr 2.18 -->2.58. Continuous bladder scans showing >344mL, in and out cath performed removing of fluid. Placed foley because patient is retaining urine. After fluid administration, patient only outputting about 60mL of urine. Repeat bladder scan showing no  urine. Patient likely dehydrated. UA showing no improvement even with adequate coverage of polymicrobial UTI. Repeat urine cultures ordered.   -F/u MRI abdomen and pelvis -urine osm 320, urine sodium 47, urine creatinine 73.6 -protein/creatinine ratio 5.05 -Strict  I/O's -Daily BMP -f/u urine culture -Continue to monitor renal function as patient on vanc and zosyn  Hypoxia: Initially there was a concern for PE at patient's nursing facilityand she was started on empiric Eliquis.D-dimer was normal. Currently on RA. PE appears less likely at this time. Lung exam today showed no overt abnormalities, however it was limited due to poor respiratory effort. -Stopped eliquis -Satting 98% on 2L [place] -CXR on 09/21/19 unchanged from priors. R base atelectasis/infiltrate with tiny R pleural effusion.  Hypokalemia K went from 3.7-->3.3 on 09/18/19.Repletedwith BID. -K 3.7 on 09/19/19 -K 2.8 on 07/22 --> repleted --> added 6 runs of IV --> K3.8  Hyperbilirubinemia TBili 1.5.possibly 2/2 dehydration. T Bili down to 1on 09/17/19. -T bili 1.7 on 09/22/19 likely d/t dehydration  HTN -Continue home clonidine  Prior to Admission Living Arrangement: [facility] Anticipated Discharge Location: TBD Barriers to Discharge: continued medical management Dispo: pending further medical evaluation  Merrilyn Puma, MD 09/22/2019, 2:24 PM Pager: [phone] After 5pm on weekdays and 1pm on weekends: On Call pager [phone]

## 2019-09-23 ENCOUNTER — Inpatient Hospital Stay (HOSPITAL_COMMUNITY): Payer: PPO

## 2019-09-23 DIAGNOSIS — G9340 Encephalopathy, unspecified: Secondary | ICD-10-CM | POA: Diagnosis not present

## 2019-09-23 DIAGNOSIS — A419 Sepsis, unspecified organism: Secondary | ICD-10-CM | POA: Diagnosis not present

## 2019-09-23 DIAGNOSIS — N179 Acute kidney failure, unspecified: Secondary | ICD-10-CM | POA: Diagnosis not present

## 2019-09-23 DIAGNOSIS — N390 Urinary tract infection, site not specified: Secondary | ICD-10-CM | POA: Diagnosis not present

## 2019-09-23 DIAGNOSIS — I472 Ventricular tachycardia: Secondary | ICD-10-CM

## 2019-09-23 LAB — BASIC METABOLIC PANEL
Anion gap: 12 (ref 5–15)
Anion gap: 11 (ref 5–15)
BUN: 28 mg/dL — ABNORMAL HIGH (ref 8–23)
BUN: 27 mg/dL — ABNORMAL HIGH (ref 8–23)
CO2: 24 mmol/L (ref 22–32)
CO2: 21 mmol/L — ABNORMAL LOW (ref 22–32)
Calcium: 8.7 mg/dL — ABNORMAL LOW (ref 8.9–10.3)
Calcium: 8.3 mg/dL — ABNORMAL LOW (ref 8.9–10.3)
Chloride: 111 mmol/L (ref 98–111)
Chloride: 116 mmol/L — ABNORMAL HIGH (ref 98–111)
Creatinine, Ser: 3.14 mg/dL — ABNORMAL HIGH (ref 0.44–1.00)
Creatinine, Ser: 3.15 mg/dL — ABNORMAL HIGH (ref 0.44–1.00)
GFR calc Af Amer: 15 mL/min — ABNORMAL LOW (ref >60)
GFR calc Af Amer: 15 mL/min — ABNORMAL LOW (ref >60)
GFR calc non Af Amer: 13 mL/min — ABNORMAL LOW (ref >60)
GFR calc non Af Amer: 13 mL/min — ABNORMAL LOW (ref >60)
Glucose, Bld: 77 mg/dL (ref 70–99)
Glucose, Bld: 84 mg/dL (ref 70–99)
Potassium: 3.2 mmol/L — ABNORMAL LOW (ref 3.5–5.1)
Potassium: 3.1 mmol/L — ABNORMAL LOW (ref 3.5–5.1)
Sodium: 147 mmol/L — ABNORMAL HIGH (ref 135–145)
Sodium: 148 mmol/L — ABNORMAL HIGH (ref 135–145)

## 2019-09-23 LAB — CBC
HCT: 32.9 % — ABNORMAL LOW (ref 36.0–46.0)
Hemoglobin: 9.7 g/dL — ABNORMAL LOW (ref 12.0–15.0)
MCH: 27.6 pg (ref 26.0–34.0)
MCHC: 29.5 g/dL — ABNORMAL LOW (ref 30.0–36.0)
MCV: 93.7 fL (ref 80.0–100.0)
Platelets: 312 10*3/uL (ref 150–400)
RBC: 3.51 MIL/uL — ABNORMAL LOW (ref 3.87–5.11)
RDW: 17.2 % — ABNORMAL HIGH (ref 11.5–15.5)
WBC: 13.4 10*3/uL — ABNORMAL HIGH (ref 4.0–10.5)
nRBC: 0 % (ref 0.0–0.2)

## 2019-09-23 LAB — URINE CULTURE: Culture: NO GROWTH

## 2019-09-23 MED ORDER — CHLORHEXIDINE GLUCONATE CLOTH 2 % EX PADS
6.0000 | MEDICATED_PAD | Freq: Every day | CUTANEOUS | Status: DC
  Administered 2019-09-23 – 2019-09-24 (×2): 6 via TOPICAL

## 2019-09-23 MED ORDER — ORAL CARE MOUTH RINSE
15.0000 mL | Freq: Two times a day (BID) | OROMUCOSAL | Status: DC
  Administered 2019-09-23 (×2): 15 mL via OROMUCOSAL

## 2019-09-23 MED ORDER — SODIUM CHLORIDE 0.9% FLUSH: 10.0000 mL | INTRAVENOUS | Status: DC | PRN

## 2019-09-23 MED ORDER — CHLORHEXIDINE GLUCONATE 0.12 % MT SOLN
15.0000 mL | Freq: Two times a day (BID) | OROMUCOSAL | Status: DC
  Administered 2019-09-23 – 2019-09-24 (×3): 15 mL via OROMUCOSAL
  Filled 2019-09-23 (×3): qty 15

## 2019-09-23 MED ORDER — SODIUM CHLORIDE 0.9 % IV BOLUS
1000.0000 mL | Freq: Once | INTRAVENOUS | Status: AC
  Administered 2019-09-23: 1000 mL via INTRAVENOUS

## 2019-09-23 MED ORDER — POTASSIUM CHLORIDE 2 MEQ/ML IV SOLN
Freq: Once | INTRAVENOUS | Status: AC
  Filled 2019-09-23: qty 500

## 2019-09-23 MED ORDER — POTASSIUM CHLORIDE 2 MEQ/ML IV SOLN: INTRAVENOUS | Status: DC

## 2019-09-23 NOTE — Progress Notes (Addendum)
Subjective: Examined patient at bedside in ICU after central line placement.  Patient able to open eyes, but nonverbal. Appears stable after central line placement.  Spoke with daughter via phone call for GOC. Daughter states that patient does not want dialysis. She reports that when patient's cognitive decline first started, that patient reported wanting to be with her husband (who is deceased). All in all, daughter's biggest concern is wanting patient to be comfortable and not in pain. Discussed next steps in management in terms of transitioning to comfort care or wanting to see how patient responds to fluid rehydration before having conversation about transitioning to comfort care. Daughter reports wanting to start with the fluid rehydration and seeing how patient responds to fluid rehydration first.  Patient moved to 5N31 after central line placement. Patient's nurse, Cicero Duck, mentioned that patient put out of urine from foley after receiving 1L NS bolus. Ordered another 1L NS bolus. Went to reassess patient while she was receiving the second bolus. Patient appearing much improved at this time. Able to say that she is alright. Reports some pain in her arms. When asked to squeeze my fingers with arms, she is able to move both arms to grab my fingers and able to squeeze with a considerable amount of strength (L>R). When asked if she wants me to call her daughter, she states that her daughter is doing good. On exam, I am able to understand her speech more clearly than in the past. She asks if she can have a cup of water. Overall, still somewhat ill-appearing, but much improved from yesterday and earlier this morning, possibly from fluid rehydration. Will recheck BMP in afternoon.  Notified daughter that patient is out of the ICU so she can come visit.  ADDENDUM: Patient on Day 8 of zosyn. Repeat urine cultures came back showing no growth. Patient has complicated UTI so will give zosyn for 2 more  days to complete 10 day course. Will reassess in 2 days to see if we need to continue abx.  Objective:  Vital signs in last 24 hours: Vitals:   09/23/19 0700 09/23/19 0800 09/23/19 0900 09/23/19 0930  BP: 124/71 (!) 114/86 95/67 103/69  Pulse: 102 103 103 99  Resp: 20 (!) 26 (!) 28 18  Temp:  99.1 F (37.3 C)  98 F (36.7 C)  TempSrc:  Axillary  Oral  SpO2: 100% 100% 97% 99%  Weight:      Height:       Physical Exam: General: Elderly female, ill-appearing, lying in bed. Eyes: Pupil moderately reactive to light. CV: Slightly tachycardic. Normal rhythm. No m/r/g. Pulm: Tachypneic. Limited exam d/t poor respiratory effort. [place] came off, patient satting at 85%. Put [place] back on with 2L, patient later at 92%. No overtly adventitious sounds noted. MSK: 1+ b/l UE edema. Neuro: Able to open eyes without being prompted. Very interactive on exam, appears improved from earlier today and yesterday. Able to grasp my fingers with both hands, strength 2/5 in right hand and 4/5 in left hand. Still appears disoriented on exam. Unable to fully assess as patient does not follow all commands.  Assessment/Plan:  Active Problems:   Sepsis (HCC)   AKI (acute kidney injury) (HCC)   Urinary tract infection   Chronic kidney disease, stage 3a   Acute encephalopathy   Benzodiazepine withdrawal with delirium [facility])  Patient is an 79 year old female with history of CKD stage 3, CAD, HTN, and recurrent UTI's (on keflex), with recent hospitalization for CAP  and acute hypoxic respiratory failure who presented with increased SOB and hypoxia from her SNF, found to haveacute encephalopathy 2/2sepsis likely 2/2 acute complicated cystitis vs benzodiazepine withdrawal.   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 Noted to have low grade fevers, tachycardia,  leukocytosis, and many bacteria on UA.She was noted to be hypoxic and was on oxygen however she no longer is requiring this. CXR was unremarkable so recurrent pneumonia appears less likely at this time.She hadsuprapubic tenderness on exam.Urine culture positive for Citrobacter freundii and E faecalis, resistant to cefazolin and ceftriaxone. WBCs increased from 15.8 to 15.1. Initiallyon vanc and cefepime, but switchedcefepime to zosyn for better coverage for citrobacter. Concern for benzodiazepine withdrawal due to history of xanax use, as per daughter. Patient clinically improved after ativan administration so on scheduled 0.5mg  ativan dose q6h, will taper as needed.  -Both of patient's midlines infiltrated on 7/22 with resultant b/l arm edema and weeping. Vascular team consulted, unable to place PICC line due to AKI. PCCM consulted, placed central line overnight. MRI abdomen w/o contrast, showing no acute abnormalities and not suggestive of malignancy. Patient does appear to have a waxing and waning presentation where she appears clinically improved in the daytime (especially in the AM) and worsens later in the day (possible sundowning).  -On 07/23, patient given 2L NS and 0.5mg  ativan in the AM, with resultant improvement in clinical presentation. Will switch scheduled ativan to q8h and then try to switch to BID tomorrow to try and taper her dosing. Discussed GOC with daughter. Patient DOES NOT want dialysis. Patient is DNR. If patient does not respond to treatment strategies (including fluid rehydration), daughter reports that she will consider comfort care approach. However, will need to have another discussion with family before pursuing this.  -Discontinued cefepime -EEG suggestive of moderate diffuse encephalopathy, but no seizures or epileptiform discharges seen. -on delirium precautions -Blood culturesnegative -CT head w/o contrast showed no acute intracranial abnormality -Daily CBC,  BMP -Continue vanc and zosyn --> monitor renal function -Given 2L NS on 07/23 -Will switch to IV ativan 0.5mg  q8h -CIWA without ativan -SLP evaluated --> recommend NPO, ice chips, sips with meds. -Will have SLP reevaluate -Central line placed   Acute on chronic renal failure: Cr up to 1.4 from baseline of around 0.7. Concern for pre-renal azotemia given decreased PO intake.Cr improved after fluid administrationCr 1.16 -->1.18-->1.14.  -On 09/21/19, creatinine increased from Cr 1.14 --> Cr 1.53. Ordered CXR to look for possible pulmonary edema from fluids, but CXR unchanged from priors with no pulmonary edema seen. Ordered CT renal stone study, showed 1.7cm exophytic lesion on lower pole of left kidney, but no evidence of urinary stones or changes in either kidney or ureter. Ordered MRI Abd Pel w/ and w/out contrast to further evaluate exophytic lesion to rule out neoplasm. Ordered urine osm, urine sodium, and urine creatinine to evaluate cause of AKI although likely prerenal 2/2 dehydration due to decreased PO intake. Given IV 500cc NS bolus on top of 100cc/hr LR infusion. Worsening kidney function could be 2/2 vanc and zosyn, will monitor response to fluids first. -On 09/22/19, creatinine increased from Cr 1.53 --> Cr 2.18 -->2.58. Continuous bladder scans showing >347mL, in and out cath performed removing of fluid. Placed foley because patient is retaining urine. After fluid administration, patient only outputting about 40mL of urine. Repeat bladder scan showing no urine. Patient  likely dehydrated. UA showing no improvement even with adequate coverage of polymicrobial UTI. Repeat urine cultures ordered. -On 7/23, given 2L NS. Will f/u urine cultures. Will f/u repeat BMP to monitor renal function.  -MRI abdomen and pelvis not suggestive of renal malignancy. -urine osm 320, urine sodium 47, urine creatinine 73.6 -protein/creatinine ratio 5.05 -Strict I/O's -Daily BMP -f/u urine  culture -Continue to monitor renal function as patient on vanc and zosyn  Hypoxia: Initially there was a concern for PE at patient's nursing facilityand she was started on empiric Eliquis.D-dimer was normal. Currently on RA. PE appears less likely at this time. Lung exam today showed no overt abnormalities, however it was limited due to poor respiratory effort. -Stopped eliquis -CXR on 09/21/19 unchanged from priors. R base atelectasis/infiltrate with tiny R pleural effusion. -On 7/23, desatted to 85% ([place] came off) --> increased to 92% on 2L [place]  Hypokalemia K went from 3.7-->3.3 on 09/18/19.Repletedwith BID. -K 3.7 on 09/19/19 -K 2.8 on 07/22 --> repleted --> added 6 runs of IV --> K 3.8  Hyperbilirubinemia TBili 1.5.possibly 2/2 dehydration. T Bili down to 1on 09/17/19. -T bili 1.7 on 09/22/19 likely d/t dehydration  HTN -Continue home clonidine   Prior to Admission Living Arrangement: [facility] Anticipated Discharge Location: TBD Barriers to Discharge: continued medical management Dispo: pending medical workup  Merrilyn Puma, MD 09/23/2019, 12:22 PM Pager: [phone] After 5pm on weekdays and 1pm on weekends: On Call pager [phone]

## 2019-09-23 NOTE — Procedures (Signed)
Central Venous Catheter Insertion Procedure Note  SAMAYA BOARDLEY  [id number]  1940/03/08  Date:09/23/19  Time:6:28 AM   Provider Performing:Brooke Lodema Hong   Procedure: Insertion of Non-tunneled Central Venous [id number]) with US guidance ([id number])   Indication(s) Difficult access, bilateral infiltrated midlines, bilateral arm weeping, not candidate for PICC given AKI  Consent Risks of the procedure as well as the alternatives and risks of each were explained to the patient and/or caregiver.  Consent for the procedure was obtained and is signed in the bedside chart  Anesthesia Topical only with 1% lidocaine  4 ml  Timeout Verified patient identification, verified procedure, site/side was marked, verified correct patient position, special equipment/implants available, medications/allergies/relevant history reviewed, required imaging and test results available.  Sterile Technique Maximal sterile technique including full sterile barrier drape, hand hygiene, sterile gown, sterile gloves, mask, hair covering, sterile ultrasound probe cover (if used).  Procedure Description Area of catheter insertion was cleaned with chlorhexidine and draped in sterile fashion.  With real-time ultrasound guidance a central venous catheter was placed into the left internal jugular vein to 17 cm.  Nonpulsatile blood flow and easy flushing noted in all ports.  The catheter was sutured in place, biopatch placed, and sterile dressing applied.  Complications/Tolerance None; patient tolerated the procedure well. Chest X-ray is ordered to verify placement for internal jugular or subclavian cannulation.   No ptx.  Line is ready for use.   EBL Minimal  Specimen(s) None    Posey Boyer, MSN, AGACNP-BC [facility] 09/23/2019, 6:30 AM  See Amion for personal pager PCCM on call pager [phone]

## 2019-09-23 NOTE — TOC Initial Note (Signed)
Transition of Care [facility]) - Initial/Assessment Note    Patient Details  Name: Judy Mooney MRN: [id number] Date of Birth: 1941-02-03  Transition of Care [facility]) CM/SW Contact:    Janae Bridgeman, RN Phone Number: 09/23/2019, 3:42 PM  Clinical Narrative:                 Case management spoke with the daughter on the phone regarding patient's goals of care and plans for transition when patient is medically stable.  Patient lives alone at home, husband recently deceased, and daughter has been checking on the patient and assisting as needed,  Patient has recently been discharged from [facility] with sepsis, UTI, and encephalopathy related to benzo withdrawal from [facility] that she was not receiving at the skilled facility.  The daughter states that she would like the patient to return to [facility] for rehab if she is able - otherwise she may need assisted living and/or comfort care depending on her medical needs.  The daughter states that the patient would not want dialysis.  Will continue to follow for care needs.  Expected Discharge Plan: Skilled Nursing Facility Barriers to Discharge: Continued Medical Work up   Patient Goals and CMS Choice Patient states their goals for this hospitalization and ongoing recovery are:: Daughter wants patient to go back to [facility]  - and then Assisted Living with comfort care CMS Medicare.gov Compare Post Acute Care list provided to:: Patient Represenative (must comment) Choice offered to / list presented to : Adult Children  Expected Discharge Plan and Services Expected Discharge Plan: Skilled Nursing Facility   Discharge Planning Services: CM Consult Post Acute Care Choice: Skilled Nursing Facility Living arrangements for the past 2 months: Single Family Home                 DME Arranged:  (Patient currently has WC, RW at home)                    Prior Living Arrangements/Services Living arrangements for the past 2 months: Single Family  Home Lives with:: Self Patient language and need for interpreter reviewed:: Yes Do you feel safe going back to the place where you live?: No   Patient lives alone and husband is deceased  Need for Family Participation in Patient Care: Yes (Comment) Care giver support system in place?: Yes (comment)   Criminal Activity/Legal Involvement Pertinent to Current Situation/Hospitalization: No - Comment as needed  Activities of Daily Living Home Assistive Devices/Equipment: Wheelchair ADL Screening (condition at time of admission) Patient's cognitive ability adequate to safely complete daily activities?: No Is the patient deaf or have difficulty hearing?: Yes Does the patient have difficulty seeing, even when wearing glasses/contacts?: No Does the patient have difficulty concentrating, remembering, or making decisions?: Yes Patient able to express need for assistance with ADLs?: No Does the patient have difficulty dressing or bathing?: Yes Independently performs ADLs?: No Does the patient have difficulty walking or climbing stairs?: Yes Weakness of Legs: Both Weakness of Arms/Hands: None  Permission Sought/Granted Permission sought to share information with : Case Manager Permission granted to share information with : Yes, Verbal Permission Granted        Permission granted to share info w Relationship: daughter - [place] No -     Emotional Assessment Appearance:: Appears stated age Attitude/Demeanor/Rapport: Inconsistent Affect (typically observed): Quiet Orientation: : Fluctuating Orientation (Suspected and/or reported Sundowners) Alcohol / Substance Use: Not Applicable Psych Involvement: No (comment)  Admission diagnosis:  Hypoxia [R09.02]  Sepsis (HCC) [A41.9] Urinary tract infection with hematuria, site unspecified [N39.0, R31.9] Sepsis without acute organ dysfunction, due to unspecified organism [facility]) [A41.9] Patient Active Problem List   Diagnosis Date Noted  .  Benzodiazepine withdrawal with delirium (HCC)   . Acute encephalopathy   . Urinary tract infection 09/17/2019  . Chronic kidney disease, stage 3a 09/17/2019  . Sepsis (HCC) 09/15/2019  . AKI (acute kidney injury) (HCC) 09/07/2019   PCP:  System, Pcp Not In Pharmacy:   [id number] - [place], [place] - [address] [facility] [place] [address] Phone: [phone] Fax: [phone]     Social Determinants of Health (SDOH) Interventions    Readmission Risk Interventions Readmission Risk Prevention Plan 09/23/2019  Transportation Screening Complete  PCP or Specialist Appt within 3-5 Days Complete  HRI or Home Care Consult Complete  Social Work Consult for Recovery Care Planning/Counseling Complete  Palliative Care Screening Complete  Medication Review [employer]) Complete  Some recent data might be hidden

## 2019-09-23 NOTE — Plan of Care (Signed)

## 2019-09-23 NOTE — Progress Notes (Signed)
Pt taken to 2H20 for central line placement

## 2019-09-23 NOTE — Progress Notes (Signed)
27M called but no answer, regarding status of bed for transfer to have procedure

## 2019-09-23 NOTE — Progress Notes (Signed)
eLink Physician-Brief Progress Note Patient Name: Judy Mooney DOB: Apr 28, 1940 MRN: [id number]   Date of Service  09/23/2019  HPI/Events of Note  PCCM consulted for  Line placement  eICU Interventions  Patient has been transported to [facility] for line placement.        Judy Mooney 09/23/2019, 6:02 AM

## 2019-09-23 NOTE — Progress Notes (Signed)
  Speech Language Pathology Treatment: Dysphagia  Patient Details Name: Judy Mooney MRN: [id number] DOB: 1940/07/19 Today's Date: 09/23/2019 Time: 1530-1540 SLP Time Calculation (min) (ACUTE ONLY): 10 min  Assessment / Plan / Recommendation Clinical Impression  Pt alert, maintained eyes closed for duration of session despite max prompting to open them and actively receive POs.  She required max tactile/verbal cues to attend to ice/water; could not draw water through straw. Teaspoons of water offered, pt achieved palpable swallow with no overt indication of aspiration, however quantities were limited.  She accepted a small bolus of applesauce with prolonged oral effort and eventual swallow.  Participation overall was limited, and willingness to accept POs was minimal.  Recommend continuing frequent oral care; allow sips of water/ice chips with discretion.  Per notes, plan is to watch for pt's response to treatment, then MD/family will have further discussions regarding plan of care and a comfort approach if that is warranted.  SLP will follow along.   HPI HPI: Patient is an 79 year old female with history of CKD stage 3, CAD, HTN, and recurrent UTI's (on keflex), with recent hospitalization for CAP and acute hypoxic respiratory failure who presented with increased SOB and hypoxia from her SNF, found to have acute encephalopathy 2/2 sepsis likely 2/2 acute complicated cystitis.       SLP Plan  Continue with current plan of care       Recommendations  Diet recommendations: NPO Liquids provided via: Teaspoon Medication Administration: Crushed with puree Compensations: Minimize environmental distractions Postural Changes and/or Swallow Maneuvers: Seated upright 90 degrees                Oral Care Recommendations: Oral care QID Follow up Recommendations: Skilled Nursing facility SLP Visit Diagnosis: Dysphagia, unspecified (R13.10) Plan: Continue with current plan of care        GO              Maxamillian Tienda L. Samson Frederic, MA CCC/SLP Acute Rehabilitation Services Office number [phone] Pager [phone]   Carolan Shiver 09/23/2019, 3:43 PM

## 2019-09-24 LAB — BASIC METABOLIC PANEL
Anion gap: 10 (ref 5–15)
BUN: 30 mg/dL — ABNORMAL HIGH (ref 8–23)
CO2: 22 mmol/L (ref 22–32)
Calcium: 8.4 mg/dL — ABNORMAL LOW (ref 8.9–10.3)
Chloride: 115 mmol/L — ABNORMAL HIGH (ref 98–111)
Creatinine, Ser: 3.43 mg/dL — ABNORMAL HIGH (ref 0.44–1.00)
GFR calc Af Amer: 14 mL/min — ABNORMAL LOW (ref >60)
GFR calc non Af Amer: 12 mL/min — ABNORMAL LOW (ref >60)
Glucose, Bld: 96 mg/dL (ref 70–99)
Potassium: 3.7 mmol/L (ref 3.5–5.1)
Sodium: 147 mmol/L — ABNORMAL HIGH (ref 135–145)

## 2019-09-24 MED ORDER — LORAZEPAM 2 MG/ML PO CONC: 1.0000 mg | ORAL | Status: DC | PRN

## 2019-09-24 MED ORDER — CHLORHEXIDINE GLUCONATE CLOTH 2 % EX PADS: 6.0000 | MEDICATED_PAD | Freq: Every day | CUTANEOUS | Status: DC

## 2019-09-24 MED ORDER — LORAZEPAM 2 MG/ML IJ SOLN: 1.0000 mg | INTRAMUSCULAR | Status: DC | PRN

## 2019-09-24 MED ORDER — GLYCOPYRROLATE 0.2 MG/ML IJ SOLN
0.2000 mg | INTRAMUSCULAR | Status: DC | PRN
  Filled 2019-09-24: qty 1

## 2019-09-24 MED ORDER — MORPHINE SULFATE (PF) 2 MG/ML IV SOLN
1.0000 mg | INTRAVENOUS | Status: DC | PRN
  Administered 2019-09-24 (×3): 1 mg via INTRAVENOUS
  Filled 2019-09-24 (×3): qty 1

## 2019-09-24 MED ORDER — GLYCOPYRROLATE 1 MG PO TABS
1.0000 mg | ORAL_TABLET | ORAL | Status: DC | PRN
  Filled 2019-09-24: qty 1

## 2019-09-24 MED ORDER — LORAZEPAM 1 MG PO TABS: 1.0000 mg | ORAL_TABLET | ORAL | Status: DC | PRN

## 2019-09-24 NOTE — Progress Notes (Signed)
SLP Cancellation Note  Patient Details Name: Judy Mooney MRN: [id number] DOB: 10-12-1940   Cancelled treatment:       Reason Eval/Treat Not Completed: Patient's level of alertness.  Spoke with RN who stated that pt was not very responsive at this time and therefore not appropriate for PO trials.  He additionally reported that the MD would likely be having discussions with the family today regarding the plan of care for the patient.  SLP will f/u as appropriate.     Shanon Rosser Cornella Emmer 09/24/2019, 11:08 AM

## 2019-09-24 NOTE — Progress Notes (Signed)
Subjective: Patient continues to be encephalopathic, grimaces to pain. No response to questions.   Spoke with daughter at bedside. Discussed poor response to fluids and continued worsening of kidney function. Continues to have a rapid decline. Daughter decided that they would like comfort care. They do not want to see her suffer anymore. Discussed that I will put in those orders and we will make sure that she is comfortable and that we limit more interventions.   Objective:  Vital signs in last 24 hours: Vitals:   09/23/19 1418 09/23/19 2000 09/23/19 2110 09/24/19 0300  BP: 110/68 (!) 112/60 (!) 110/56 (!) 119/61  Pulse: 95 91  98  Resp: 17 20  20   Temp: 98.2 F (36.8 C) 98.3 F (36.8 C)  98.1 F (36.7 C)  TempSrc: Oral Axillary  Axillary  SpO2: 98% 97%  97%  Weight:      Height:       General: Elderly female, ill appearing, laying in bed Cardiac: RRR, no m/r/g Pulmonary: Tachypneic, limited exam 2/2 poor respiratory effort, no obvious crackles or wheezing noted Neuro: Bilateral dilated pupils, minimally responsive to light. Does not follow commands, does not open eyes. Grimaces to sternal rub.   Assessment/Plan:  Principal Problem:   Urinary tract infection Active Problems:   Sepsis (HCC)   AKI (acute kidney injury) (HCC)   Chronic kidney disease, stage 3a   Acute encephalopathy   Benzodiazepine withdrawal with delirium [facility])  Patient is an 79 year old female with history of CKD stage 3, CAD, HTN, and recurrent UTI's (on keflex), with recent hospitalization for CAP and acute hypoxic respiratory failure who presented with increased SOB and hypoxia from her SNF, found to haveacute encephalopathy 2/2sepsis likely 2/2 acute complicated cystitisvs benzodiazepine withdrawal.   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 Noted  to have low grade fevers, tachycardia, leukocytosis, and many bacteria on UA.She was noted to be hypoxic and was on oxygen however she no longer is requiring this. CXR was unremarkable so recurrent pneumonia appears less likely at this time.She hadsuprapubic tenderness on exam.Urine culture positive for Citrobacter freundii and E faecalis, resistant to cefazolin and ceftriaxone. WBCs increased from 15.8 to 15.1. Initiallyon vanc and cefepime, but switchedcefepime to zosyn for better coverage for citrobacter. Concern for benzodiazepine withdrawal due to history of xanax use, as per daughter. Patient clinically improved after ativan administration so on scheduled 0.5mg  ativan dose q6h, will taper as needed.  -Both of patient's midlines infiltrated on 7/22 with resultant b/l arm edema and weeping. Vascular team consulted, unable to place PICC line due to AKI. PCCM consulted, placed central line overnight. MRI abdomen w/o contrast, showing no acute abnormalities and not suggestive of malignancy. Patient does appear to have a waxing and waning presentation where she appears clinically improved in the daytime (especially in the AM) and worsens later in the day (possible sundowning).  -On 07/23, patient given 2L NS and 0.5mg  ativan in the AM, with resultant improvement in clinical presentation. Will switch scheduled ativan to q8h and then try to switch to BID tomorrow to try and taper her dosing. Discussed GOC with daughter. Patient DOES NOT want dialysis. Patient is DNR. If patient does not respond to treatment strategies (including fluid rehydration), daughter reports that she will consider comfort care approach. However, will need to have another discussion with family before pursuing this. -On 7/24, patient continues to be  encephalopathic with little response, has bilateral dilated pupils and continues to not follow commands. Etiology for her encephalopathy is still unclear. Daughter has decided on comfort  care. Will place orders.   -Discontinue antibiotics.  -Continue IV ativan 0.5mg  q8h -Comfort care measures ordered -Consulted palliative care and TOC for potential hospice -Discontinued lab draws  Acute on chronic renal failure: Cr up to 1.4 from baseline of around 0.7. Concern for pre-renal azotemia given decreased PO intake.Cr improved after fluid administrationCr 1.16 -->1.18-->1.14. -On 09/21/19, creatinine increased from Cr 1.14 --> Cr 1.53. Ordered CXR to look for possible pulmonary edema from fluids, but CXR unchanged from priors with no pulmonary edema seen. Ordered CT renal stone study, showed 1.7cm exophytic lesion on lower pole of left kidney, but no evidence of urinary stones or changes in either kidney or ureter. Ordered MRI Abd Pel w/ and w/out contrast to further evaluate exophytic lesion to rule out neoplasm. Ordered urine osm, urine sodium, and urine creatinine to evaluate cause of AKI although likely prerenal 2/2 dehydration due to decreased PO intake. Given IV 500cc NS bolus on top of 100cc/hr LR infusion. Worsening kidney function could be 2/2 vanc and zosyn, will monitor response to fluids first. -On 09/22/19, creatinine increased from Cr 1.53 --> Cr 2.18 -->2.58. Continuous bladder scans showing >358mL, in and out cath performed removing of fluid. Placed foley because patient is retaining urine. After fluid administration, patient only outputting about 59mL of urine. Repeat bladder scan showing no urine. Patient likely dehydrated. UA showing no improvement even with adequate coverage of polymicrobial UTI. Repeat urine cultures ordered. -On 7/23, given 2L NS. Will f/u urine cultures. Will f/u repeat BMP to monitor renal function.  -On 7/24, Cr worsened to 3.4. Had 300 cc output in foley. Not responding well to fluids, daughter reported patient did not want dialysis. Urine culture on 7/22 showed no growth. Transitioning to comfort care.   Prior to Admission Living  Arrangement: [facility] Anticipated Discharge Location: In hospital death vs hospice Dispo: Comfort care  Claudean Severance, MD 09/24/2019, 6:42 AM Pager: [phone] After 5pm on weekdays and 1pm on weekends: On Call pager [phone]

## 2019-10-02 NOTE — Death Summary Note (Signed)
  Name: YAMILEE HARMES MRN: [id number] DOB: 1940-04-07 79 y.o.  Date of Admission: 09/24/2019  4:15 PM Date of Discharge: 10/18/2019 Attending Physician: Reymundo Poll, MD  Discharge Diagnosis: Principal Problem:   Urinary tract infection Active Problems:   Sepsis (HCC)   AKI (acute kidney injury) (HCC)   Chronic kidney disease, stage 3a   Acute encephalopathy   Benzodiazepine withdrawal with delirium (HCC)  Cause of death: Multiorgan failure Time of death: 12:10 PM  Disposition and follow-up:   Ms.Nhung C Mandarino was discharged from [facility] in expired condition.    Hospital Course: 79 year old female with a history of CKD stage 3, CAD, HTN, and recurrent UTIs who had recently been hospitalized for CAP and acute hypoxic respiratory failure who initially presented with worsening SOB and hypoxia, noted to have acute encephalopathy 2/2 sepsis vs benzo withdrawal. Patient was treated with broad spectrum antibiotics, fluids, and ativan. She continued to have waxing and waning encephalopathy that continued to worsen during the course of her hospitalization despite interventions. Work up including CT head, EEG, blood cultures, urine cultures, and CXRs were relatively unremarkable. She subsequently developed acute renal failure that did not respond to fluids, patient would not want to be on dialysis. She continued to decline and family decided to transition to comfort care. Comfort measures were ordered and transitioned to comfort care.   Signed: Claudean Severance, MD 2019/10/18, 12:31 PM

## 2019-10-02 NOTE — Progress Notes (Signed)
° °  Subjective: Patient seen at bedside, family in room. Reported patient has been comfortable since she has been transitioned to comfort care. She has been having times where she is not breathing. Offered support.  Notified later on rounds that patient passed.   Objective:  Vital signs in last 24 hours: Vitals:   09/23/19 2000 09/23/19 2110 09/24/19 0300 09/24/19 0848  BP: (!) 112/60 (!) 110/56 (!) 119/61 99/66  Pulse: 91  98 100  Resp: 20  20   Temp: 98.3 F (36.8 C)  98.1 F (36.7 C) 99.1 F (37.3 C)  TempSrc: Axillary  Axillary Axillary  SpO2: 97%  97% 92%  Weight:      Height:       General: Ill appearing female, comfortable, NAD Neuro: Not awake or alert, laying still  Assessment/Plan:  Principal Problem:   Urinary tract infection Active Problems:   Sepsis (HCC)   AKI (acute kidney injury) (HCC)   Chronic kidney disease, stage 3a   Acute encephalopathy   Benzodiazepine withdrawal with delirium (HCC)  Patient is an 79 year old female with history of CKD stage 3, CAD, HTN, and recurrent UTI's (on keflex), with recent hospitalization for CAP and acute hypoxic respiratory failure who presented with increased SOB and hypoxia from her SNF, found to haveacute encephalopathy 2/2sepsis likely 2/2 acute complicated cystitisvs benzodiazepine withdrawal.   Acute Encephalopathy: Sepsis 2/2 UTIfrom Citrobacter freundii and E facecalis: Benzodiazepine Withdrawal 2/2 chronic xanax use: Delirium: Comfort care: -Transitioned to comfort care, all antibiotics and further interventions discontinued. Today she appears comfortable. Patient passed away later this morning.   Acute on chronic renal failure: Continued to worsen despite interventions. Patient did not want dialysis. Transitioned to comfort care as above.   Prior to Admission Living Arrangement: [facility] Dispo: in hospital death  Claudean Severance, MD 2019/10/13, 6:29 AM Pager: [phone] After 5pm on weekdays and  1pm on weekends: On Call pager [phone]

## 2019-10-02 NOTE — Progress Notes (Signed)
1210pm: Patient unresponsive to painful stimuli, heart and lung sounds are absent. No spontaneous respiratory activity. Family at bedside at time of death. 2 nurses pronounced T.O.D @ 1210. Family provided funeral home information.

## 2019-10-02 NOTE — Plan of Care (Signed)
  Problem: Clinical Measurements: Goal: Ability to maintain clinical measurements within normal limits will improve Outcome: Not Applicable Goal: Will remain free from infection Outcome: Not Applicable Goal: Diagnostic test results will improve Outcome: Not Applicable Goal: Respiratory complications will improve Outcome: Not Applicable   Problem: Activity: Goal: Risk for activity intolerance will decrease Outcome: Not Applicable   Problem: Elimination: Goal: Will not experience complications related to bowel motility Outcome: Not Progressing Goal: Will not experience complications related to urinary retention Outcome: Not Progressing   Problem: Nutrition: Goal: Adequate nutrition will be maintained Outcome: Not Applicable   Problem: Pain Managment: Goal: General experience of comfort will improve Outcome: Progressing

## 2019-10-02 DEATH — deceased

## 2022-06-14 IMAGING — MR MR ABDOMEN W/O CM
9 series · 48 of 48 positions shown · non-contrast
Comparison: CT 09/21/2019

CLINICAL DATA: Renal mass.  Newly diagnosed acute renal failure.

EXAM:
MRI ABDOMEN WITHOUT CONTRAST
TECHNIQUE: Multiplanar multisequence MR imaging was performed without the
administration of intravenous contrast.

[Series 3: cor haste · coronal · 6.0mm · 1.19mm/px · 3 of 30 slices shown]
[im 1/30]
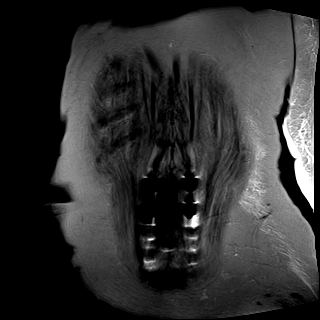
[im 15/30]
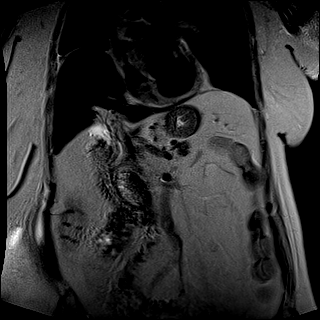
[im 30/30]
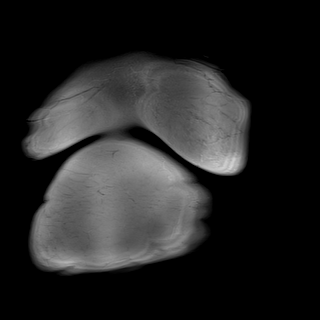

[Series 4: ax haste · axial · 6.0mm · 1.19mm/px · z∈[-244,+8]mm · 3 of 36 slices shown]
[im 1/36]
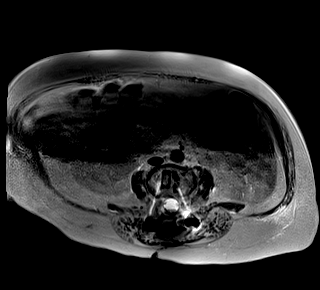
[im 18/36]
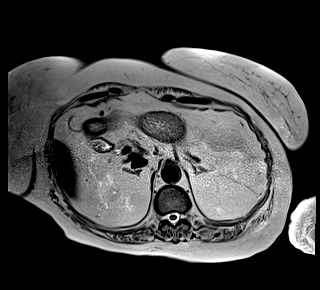
[im 36/36]
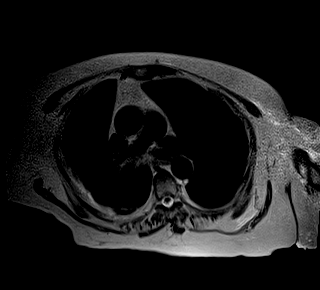

[Series 7: T2 fat-sat · axial · 6.0mm · 1.19mm/px · z∈[-244,+8]mm · 3 of 36 slices shown]
[im 1/36]
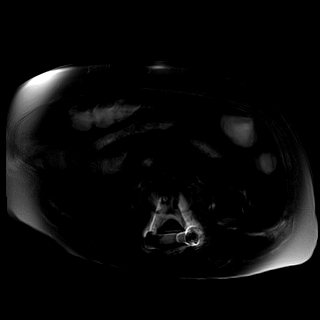
[im 18/36]
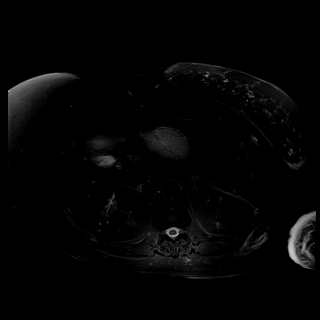
[im 36/36]
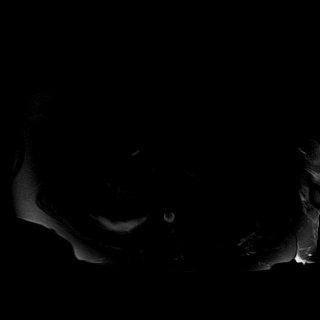

[Series 9: ax in and · axial · 3.0mm · 1.48mm/px · z∈[-252,+9]mm · 8 of 88 slices shown (1 of 2)]
[im 1/88]
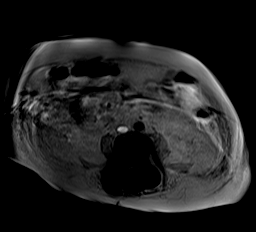
[im 13/88]
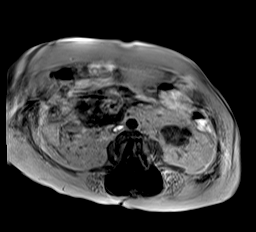
[im 25/88]
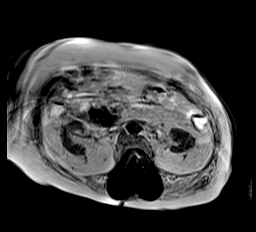
[im 38/88]
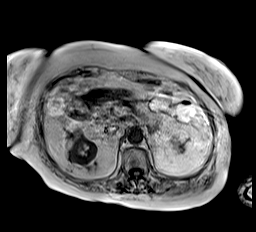
[im 50/88]
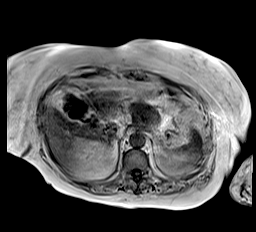
[im 63/88]
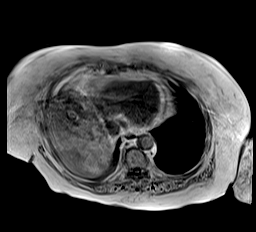
[im 75/88]
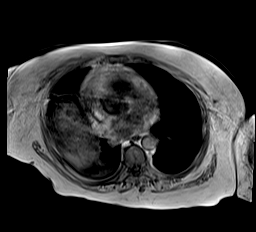
[im 88/88]
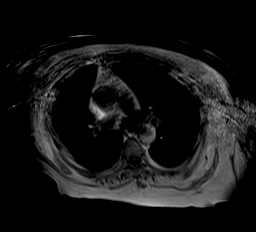

[Series 9: ax in and · axial · 3.0mm · 1.48mm/px · z∈[-252,+9]mm · 8 of 88 slices shown (2 of 2)]
[im 1/88]
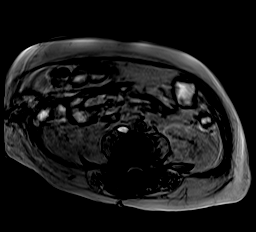
[im 13/88]
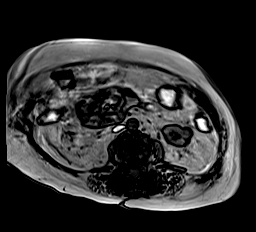
[im 25/88]
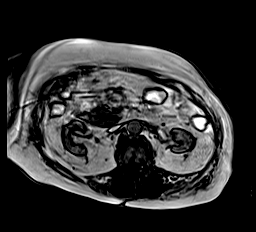
[im 38/88]
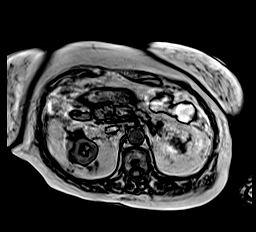
[im 50/88]
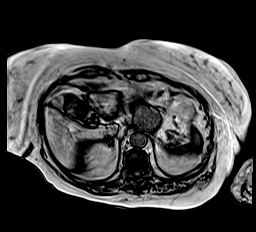
[im 63/88]
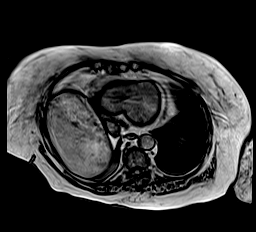
[im 75/88]
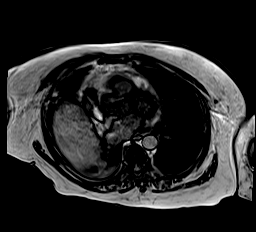
[im 88/88]
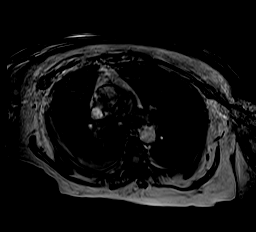

[Series 10: DWI · axial · 6.0mm · 1.42mm/px · z∈[-247,+5]mm · 9 of 108 slices shown (1 of 2)]
[im 1/108]
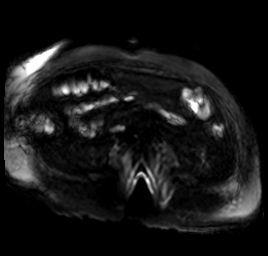
[im 14/108]
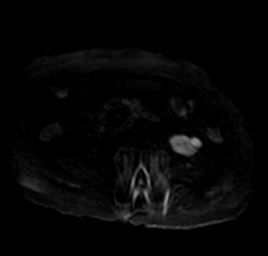
[im 27/108]
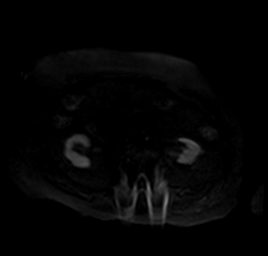
[im 41/108]
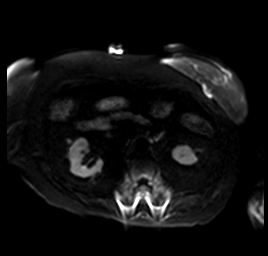
[im 54/108]
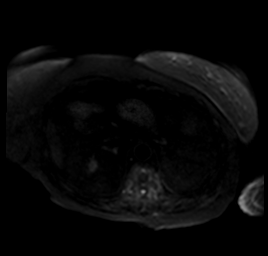
[im 67/108]
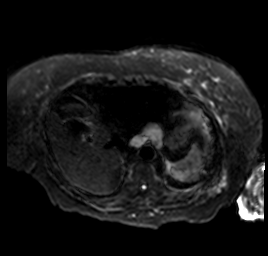
[im 81/108]
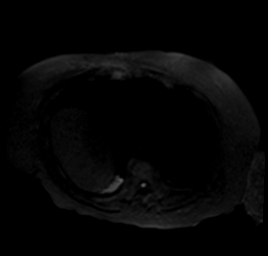
[im 94/108]
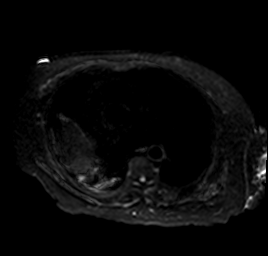
[im 108/108]
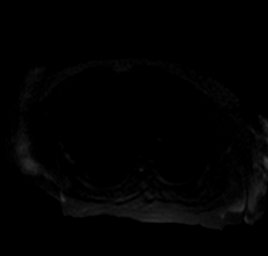

[Series 11: DWI · axial · 6.0mm · 1.42mm/px · z∈[-247,+5]mm · 3 of 36 slices shown (2 of 2)]
[im 1/36]
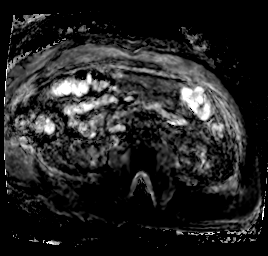
[im 18/36]
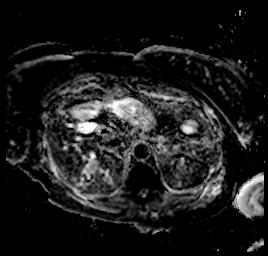
[im 36/36]
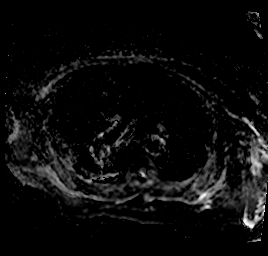

[Series 12: bSSFP · axial · 6.0mm · 0.74mm/px · z∈[-244,+8]mm · 3 of 36 slices shown]
[im 1/36]
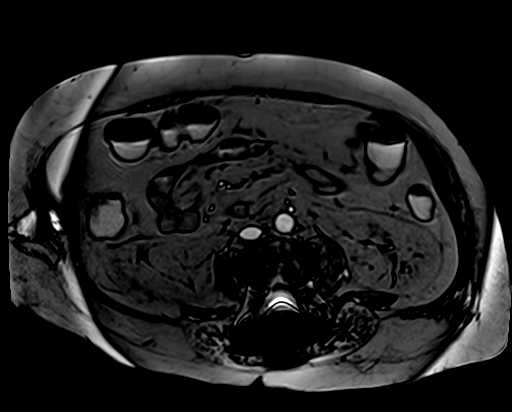
[im 18/36]
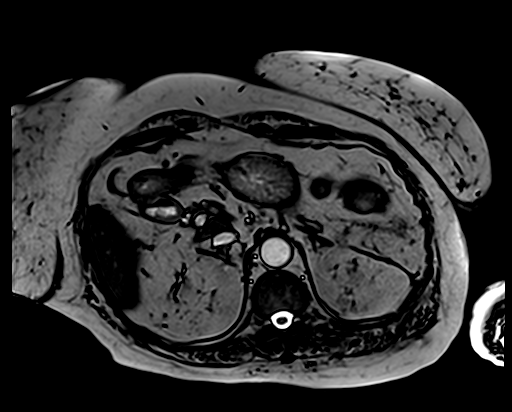
[im 36/36]
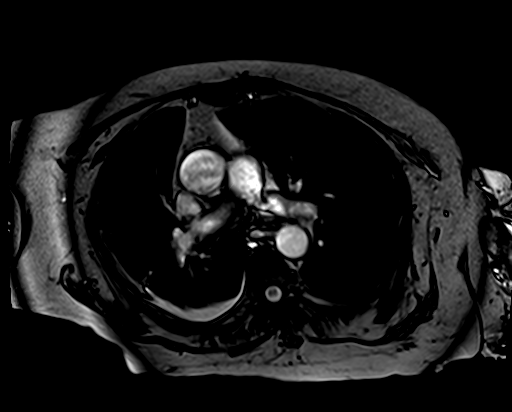

[Series 13: T1 dynamic · axial · non-contrast · 3.0mm · 1.48mm/px · z∈[-252,+9]mm · 8 of 88 slices shown]
[im 1/88]
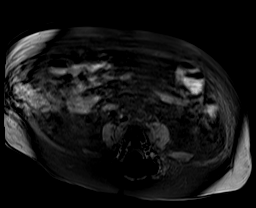
[im 13/88]
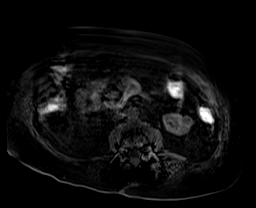
[im 25/88]
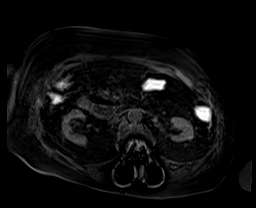
[im 38/88]
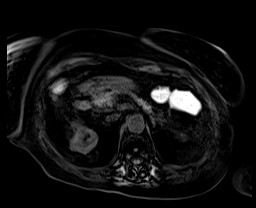
[im 50/88]
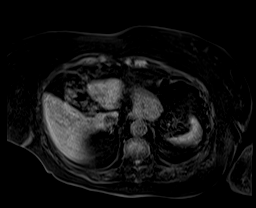
[im 63/88]
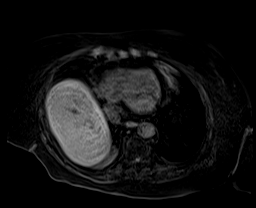
[im 75/88]
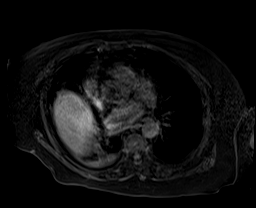
[im 88/88]
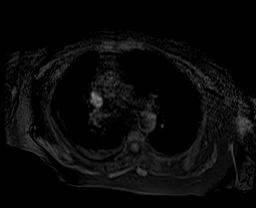

[48 of 48 positions shown; findings below may reference images not displayed]

FINDINGS: Lower chest: Atelectasis, airspace consolidation and small pleural
effusion is identified within the imaged portions of the right lower
lobe.

Hepatobiliary: Status post cholecystectomy. No biliary ductal
dilatation. No abnormal signal identified on the diffusion-weighted
sequence. No abnormal areas of T1 or T2 signal.

Pancreas:  No inflammation, main duct dilatation or mass identified.

Spleen:  Within normal limits in size and appearance.

Adrenals/Urinary Tract:  The adrenal glands appear normal.

No hydronephrosis. Corresponding to the exophytic lesion arising off
the inferior pole of left kidney is a 1.5 x 1.7 cm, slightly
exophytic, T2 hyperintense and T1 iso- to hypointense structure,
image [DATE]. Although technically incompletely characterized without
IV contrast no complicating features are noted within this
structure. Specifically, there is no internal septation or mural
nodule. Several additional small T2 hyperintense cystic lesions are
noted within the anterior and posterior cortex of the left mid
kidney, image [DATE] and image [DATE]. Unremarkable appearance of the
right kidney.

Stomach/Bowel: Visualized portions within the abdomen are
unremarkable.

Vascular/Lymphatic: Aortic atherosclerosis. No aneurysm identified.
No abdominal adenopathy.

Other:  No ascites or focal fluid collections.

Musculoskeletal: Postsurgical changes are identified within the
lumbar spine compatible with prior PLIF of L1 through L5. Superior
endplate compression deformity is noted at T12, image [DATE].
IMPRESSION: 1. Corresponding to the CT abnormality is a 1.7 cm partially
exophytic lesion arising from the inferior pole of left kidney.
Although technically incompletely characterized without IV contrast
there are no suspicious unenhanced features identified by MRI.
2. Several additional small cystic lesions within the left kidney
are also noted without signs of internal septation or mural
nodularity.
3. Atelectasis, airspace consolidation and small pleural effusion is
identified within the imaged portions of the right lower lobe.

## 2022-06-15 IMAGING — DX DG CHEST 1V PORT
1 series · 1 of 1 positions shown · non-contrast
Comparison: 09/21/2019.

CLINICAL DATA: Central line placement.  Shortness of breath.

EXAM:
PORTABLE CHEST 1 VIEW

[chest ap]
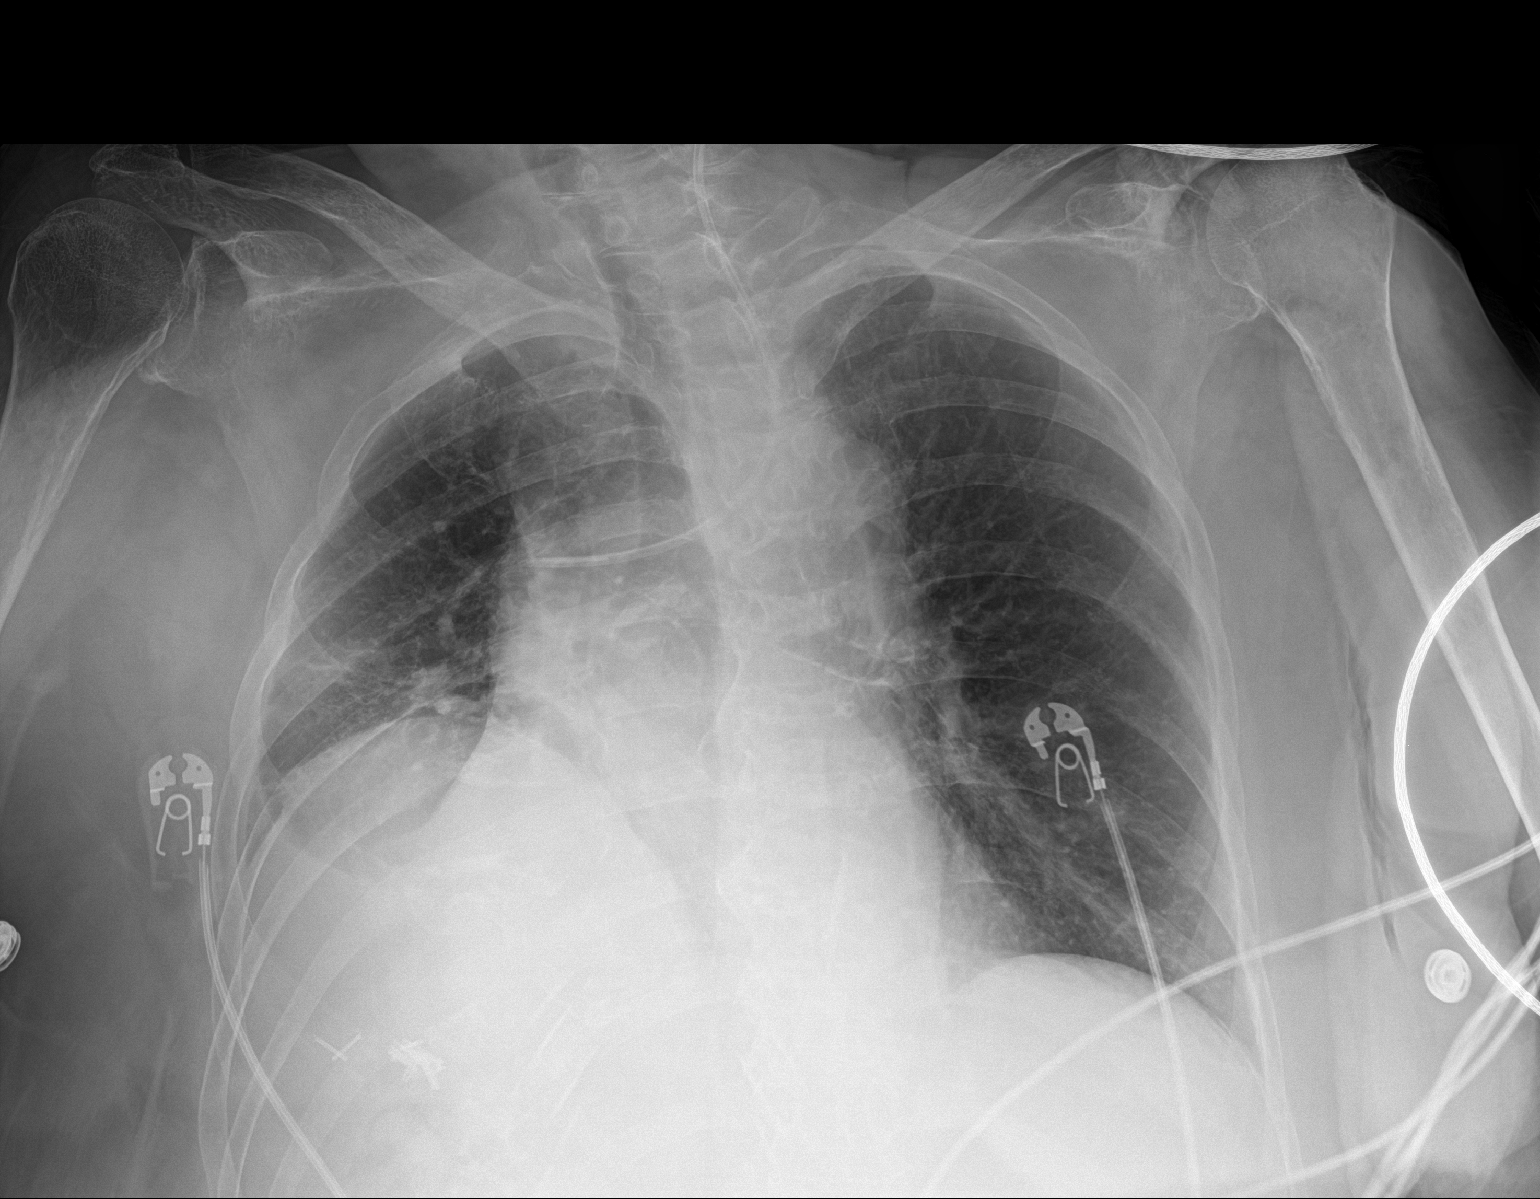

[1 of 1 positions shown; findings below may reference images not displayed]

FINDINGS: Left IJ line noted with tip over superior vena cava. Heart size
stable. Persistent right base atelectasis/infiltrate. Persistent
small right pleural effusion. No pneumothorax. Stable elevation
right hemidiaphragm. Degenerative changes thoracic spine. Surgical
clips right upper quadrant.
IMPRESSION: 1.  Left IJ line noted with tip over superior vena cava.

2. Persistent right base atelectasis/infiltrate. Persistent small
right pleural effusion. No interim change from prior exam.
